# Patient Record
Sex: Female | Born: 1995 | Race: White | Hispanic: No | Marital: Single | Smoking: Former smoker
Health system: Southern US, Community
[De-identification: ages and names within clinical notes are randomized; demographics above are authoritative.]

## PROBLEM LIST (undated history)

## (undated) DIAGNOSIS — R519 Headache, unspecified: Secondary | ICD-10-CM

## (undated) DIAGNOSIS — F419 Anxiety disorder, unspecified: Secondary | ICD-10-CM

## (undated) DIAGNOSIS — F32A Depression, unspecified: Secondary | ICD-10-CM

## (undated) DIAGNOSIS — I471 Supraventricular tachycardia, unspecified: Secondary | ICD-10-CM

## (undated) DIAGNOSIS — F320 Major depressive disorder, single episode, mild: Secondary | ICD-10-CM

## (undated) DIAGNOSIS — R51 Headache: Secondary | ICD-10-CM

## (undated) DIAGNOSIS — R87612 Low grade squamous intraepithelial lesion on cytologic smear of cervix (LGSIL): Secondary | ICD-10-CM

## (undated) DIAGNOSIS — F319 Bipolar disorder, unspecified: Secondary | ICD-10-CM

## (undated) DIAGNOSIS — Z23 Encounter for immunization: Secondary | ICD-10-CM

## (undated) HISTORY — DX: Headache: R51

## (undated) HISTORY — DX: Low grade squamous intraepithelial lesion on cytologic smear of cervix (LGSIL): R87.612

## (undated) HISTORY — DX: Headache, unspecified: R51.9

## (undated) HISTORY — DX: Anxiety disorder, unspecified: F41.9

## (undated) HISTORY — DX: Depression, unspecified: F32.A

## (undated) HISTORY — DX: Encounter for immunization: Z23

## (undated) HISTORY — DX: Major depressive disorder, single episode, mild: F32.0

---
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
## 2014-09-05 ENCOUNTER — Emergency Department (HOSPITAL_COMMUNITY)
Admission: EM | Admit: 2014-09-05 | Discharge: 2014-09-05 | Disposition: A | Discharge status: Other Acute Inpatient Hospital | Payer: BLUE CROSS/BLUE SHIELD | Attending: Emergency Medicine | Admitting: Emergency Medicine

## 2014-09-05 ENCOUNTER — Inpatient Hospital Stay (HOSPITAL_COMMUNITY)
Admission: AD | Admit: 2014-09-05 | Discharge: 2014-09-12 | DRG: 885 | Disposition: A | Discharge status: Home / Self Care | Payer: BLUE CROSS/BLUE SHIELD | Attending: Psychiatry | Admitting: Psychiatry

## 2014-09-05 ENCOUNTER — Encounter (HOSPITAL_COMMUNITY): Payer: Self-pay

## 2014-09-05 ENCOUNTER — Encounter (HOSPITAL_COMMUNITY): Payer: Self-pay | Admitting: *Deleted

## 2014-09-05 ENCOUNTER — Encounter (HOSPITAL_COMMUNITY): Payer: Self-pay | Admitting: Emergency Medicine

## 2014-09-05 DIAGNOSIS — F410 Panic disorder [episodic paroxysmal anxiety] without agoraphobia: Secondary | ICD-10-CM | POA: Diagnosis present

## 2014-09-05 DIAGNOSIS — G4700 Insomnia, unspecified: Secondary | ICD-10-CM | POA: Diagnosis present

## 2014-09-05 DIAGNOSIS — F332 Major depressive disorder, recurrent severe without psychotic features: Principal | ICD-10-CM | POA: Diagnosis present

## 2014-09-05 DIAGNOSIS — Z3202 Encounter for pregnancy test, result negative: Secondary | ICD-10-CM | POA: Insufficient documentation

## 2014-09-05 DIAGNOSIS — R45851 Suicidal ideations: Secondary | ICD-10-CM | POA: Diagnosis present

## 2014-09-05 DIAGNOSIS — F1220 Cannabis dependence, uncomplicated: Secondary | ICD-10-CM | POA: Diagnosis present

## 2014-09-05 DIAGNOSIS — F419 Anxiety disorder, unspecified: Secondary | ICD-10-CM | POA: Insufficient documentation

## 2014-09-05 DIAGNOSIS — F32A Depression, unspecified: Secondary | ICD-10-CM

## 2014-09-05 DIAGNOSIS — F12280 Cannabis dependence with cannabis-induced anxiety disorder: Secondary | ICD-10-CM | POA: Insufficient documentation

## 2014-09-05 DIAGNOSIS — F329 Major depressive disorder, single episode, unspecified: Secondary | ICD-10-CM | POA: Insufficient documentation

## 2014-09-05 DIAGNOSIS — Z793 Long term (current) use of hormonal contraceptives: Secondary | ICD-10-CM | POA: Diagnosis not present

## 2014-09-05 DIAGNOSIS — R110 Nausea: Secondary | ICD-10-CM | POA: Diagnosis not present

## 2014-09-05 DIAGNOSIS — F3112 Bipolar disorder, current episode manic without psychotic features, moderate: Secondary | ICD-10-CM | POA: Diagnosis present

## 2014-09-05 HISTORY — DX: Bipolar disorder, unspecified: F31.9

## 2014-09-05 LAB — COMPREHENSIVE METABOLIC PANEL
ALK PHOS: 40 U/L (ref 39–117)
ALT: 28 U/L (ref 0–35)
ANION GAP: 16 — AB (ref 5–15)
AST: 32 U/L (ref 0–37)
Albumin: 5 g/dL (ref 3.5–5.2)
BUN: 8 mg/dL (ref 6–23)
CALCIUM: 9.4 mg/dL (ref 8.4–10.5)
CO2: 21 mmol/L (ref 19–32)
Chloride: 103 mmol/L (ref 96–112)
Creatinine, Ser: 0.78 mg/dL (ref 0.50–1.10)
GFR calc non Af Amer: >90 mL/min (ref >90)
GLUCOSE: 73 mg/dL (ref 70–99)
Potassium: 3.8 mmol/L (ref 3.5–5.1)
Sodium: 140 mmol/L (ref 135–145)
Total Bilirubin: 0.7 mg/dL (ref 0.3–1.2)
Total Protein: 8 g/dL (ref 6.0–8.3)

## 2014-09-05 LAB — POC URINE PREG, ED: PREG TEST UR: NEGATIVE

## 2014-09-05 LAB — CBC
HCT: 40.8 % (ref 36.0–46.0)
Hemoglobin: 14 g/dL (ref 12.0–15.0)
MCH: 30.8 pg (ref 26.0–34.0)
MCHC: 34.3 g/dL (ref 30.0–36.0)
MCV: 89.9 fL (ref 78.0–100.0)
Platelets: 204 10*3/uL (ref 150–400)
RBC: 4.54 MIL/uL (ref 3.87–5.11)
RDW: 12.4 % (ref 11.5–15.5)
WBC: 9 10*3/uL (ref 4.0–10.5)

## 2014-09-05 LAB — RAPID URINE DRUG SCREEN, HOSP PERFORMED
AMPHETAMINES: NOT DETECTED
BENZODIAZEPINES: NOT DETECTED
Barbiturates: NOT DETECTED
COCAINE: NOT DETECTED
OPIATES: NOT DETECTED
TETRAHYDROCANNABINOL: POSITIVE — AB

## 2014-09-05 LAB — ETHANOL: Alcohol, Ethyl (B): <5 mg/dL (ref 0–9)

## 2014-09-05 LAB — CBG MONITORING, ED
GLUCOSE-CAPILLARY: 71 mg/dL (ref 70–99)
Glucose-Capillary: 67 mg/dL — ABNORMAL LOW (ref 70–99)

## 2014-09-05 MED ORDER — HYDROXYZINE HCL 25 MG PO TABS
25.0000 mg | ORAL_TABLET | Freq: Four times a day (QID) | ORAL | Status: DC | PRN
  Administered 2014-09-05 – 2014-09-10 (×10): 25 mg via ORAL
  Filled 2014-09-05 (×12): qty 1

## 2014-09-05 MED ORDER — MAGNESIUM HYDROXIDE 400 MG/5ML PO SUSP: 30.0000 mL | Freq: Every day | ORAL | Status: DC | PRN

## 2014-09-05 MED ORDER — DROSPIRENONE-ETHINYL ESTRADIOL 3-0.02 MG PO TABS
1.0000 | ORAL_TABLET | Freq: Every day | ORAL | Status: DC
  Administered 2014-09-06 – 2014-09-12 (×7): 1 via ORAL

## 2014-09-05 MED ORDER — ACETAMINOPHEN 325 MG PO TABS
650.0000 mg | ORAL_TABLET | Freq: Four times a day (QID) | ORAL | Status: DC | PRN
Start: 2014-09-05 — End: 2014-09-12
  Administered 2014-09-07 – 2014-09-10 (×6): 650 mg via ORAL
  Filled 2014-09-05 (×6): qty 2

## 2014-09-05 MED ORDER — ALUM & MAG HYDROXIDE-SIMETH 200-200-20 MG/5ML PO SUSP
30.0000 mL | ORAL | Status: DC | PRN
  Administered 2014-09-07: 30 mL via ORAL
  Filled 2014-09-05: qty 30

## 2014-09-05 MED ORDER — ONDANSETRON HCL 4 MG PO TABS
4.0000 mg | ORAL_TABLET | Freq: Three times a day (TID) | ORAL | Status: DC | PRN
  Administered 2014-09-05: 4 mg via ORAL
  Filled 2014-09-05: qty 1

## 2014-09-05 MED ORDER — IBUPROFEN 200 MG PO TABS: 600.0000 mg | ORAL_TABLET | Freq: Three times a day (TID) | ORAL | Status: DC | PRN

## 2014-09-05 MED ORDER — ZOLPIDEM TARTRATE 5 MG PO TABS
5.0000 mg | ORAL_TABLET | Freq: Every day | ORAL | Status: DC
  Administered 2014-09-05 – 2014-09-06 (×2): 5 mg via ORAL
  Filled 2014-09-05 (×2): qty 1

## 2014-09-05 MED ORDER — ENSURE ENLIVE PO LIQD: 237.0000 mL | Freq: Two times a day (BID) | ORAL | Status: DC

## 2014-09-05 MED ORDER — LORAZEPAM 1 MG PO TABS
1.0000 mg | ORAL_TABLET | Freq: Three times a day (TID) | ORAL | Status: DC | PRN
  Administered 2014-09-05: 1 mg via ORAL
  Filled 2014-09-05: qty 1

## 2014-09-05 MED ORDER — ACETAMINOPHEN 325 MG PO TABS: 650.0000 mg | ORAL_TABLET | ORAL | Status: DC | PRN

## 2014-09-05 NOTE — ED Notes (Signed)
[facility] bed [facility]. I am about to call report at [phone] as advised by the [facility] represantive caller.

## 2014-09-05 NOTE — Tx Team (Signed)
Initial Interdisciplinary Treatment Plan   PATIENT STRESSORS: Financial difficulties Medication change or noncompliance   PATIENT STRENGTHS: Average or above average intelligence Capable of independent living General fund of knowledge   PROBLEM LIST: Problem List/Patient Goals Date to be addressed Date deferred Reason deferred Estimated date of resolution  I want ot work on my depression and anxiety                                                       DISCHARGE CRITERIA:  Ability to meet basic life and health needs Improved stabilization in mood, thinking, and/or behavior Verbal commitment to aftercare and medication compliance  PRELIMINARY DISCHARGE PLAN: Attend aftercare/continuing care group Return to previous living arrangement Return to previous work or school arrangements  PATIENT/FAMIILY INVOLVEMENT: This treatment plan has been presented to and reviewed with the patient, Emily Hansen, and/or family member, .  The patient and family have been given the opportunity to ask questions and make suggestions.  [name], [name] J 09/05/2014, 9:31 PM

## 2014-09-05 NOTE — ED Notes (Signed)
Pt requesting to eat prior to admit/transfer, we have trays being delivered. Call[facility] then [place] to complete the transfer while pt eats dinner.

## 2014-09-05 NOTE — ED Notes (Signed)
Per pt, states she has been dealing with sadness since the 9 th grade-[facility]
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
## 2014-09-05 NOTE — ED Provider Notes (Signed)
CSN: [id number]     Arrival date & time 09/05/14  1437 History   First MD Initiated Contact with Patient 09/05/14 1549     Chief Complaint  Patient presents with  . Depression     (Consider location/radiation/quality/duration/timing/severity/associated sxs/prior Treatment) HPI Comments: Patient with PMH of bipolar presents to the ED with anxiety, anorexia, and depression.  Patient states that she has not eaten for the past 2 weeks because she feels anxious and nauseated.  Patient states that she has been dealing with depression since she was in the 9th grade.  She states that her symptoms are worsening, and she would like professional help.  She denies any SI or HI, but states that if she were to get "hit by a car," she wouldn't care.  She reports drinking Etoh occasionally, and using pot regularly.  There are no aggravating or alleviating factors.  The history is provided by the patient. No language interpreter was used.    Past Medical History  Diagnosis Date  . Bipolar disorder    History reviewed. No pertinent past surgical history. No family history on file. History  Substance Use Topics  . Smoking status: Never Smoker   . Smokeless tobacco: Not on file  . Alcohol Use: Yes     Comment: has not drank in 2 weeks, social drinker   OB History    No data available     Review of Systems  Constitutional: Negative for fever and chills.  Respiratory: Negative for shortness of breath.   Cardiovascular: Negative for chest pain.  Gastrointestinal: Positive for nausea. Negative for vomiting, diarrhea and constipation.  Genitourinary: Negative for dysuria.  Psychiatric/Behavioral: Positive for behavioral problems. The patient is nervous/anxious.   All other systems reviewed and are negative.     Allergies  Prozac  Home Medications   Prior to Admission medications   Medication Sig Start Date End Date Taking? Authorizing Provider  drospirenone-ethinyl estradiol  (YAZ,GIANVI,LORYNA) 3-0.02 MG tablet Take 1 tablet by mouth daily.    Historical Provider, MD   BP 133/88 mmHg  Pulse 84  Temp(Src) 98.2 F (36.8 C) (Oral)  Resp 18  SpO2 100%  LMP 09/05/2014 Physical Exam  Constitutional: She is oriented to person, place, and time. She appears well-developed and well-nourished.  HENT:  Head: Normocephalic and atraumatic.  Eyes: Conjunctivae and EOM are normal. Pupils are equal, round, and reactive to light.  Neck: Normal range of motion. Neck supple.  Cardiovascular: Normal rate and regular rhythm.  Exam reveals no gallop and no friction rub.   No murmur heard. Pulmonary/Chest: Effort normal and breath sounds normal. No respiratory distress. She has no wheezes. She has no rales. She exhibits no tenderness.  Abdominal: Soft. Bowel sounds are normal. She exhibits no distension and no mass. There is no tenderness. There is no rebound and no guarding.  No focal abdominal tenderness, no RLQ tenderness or pain at McBurney's point, no RUQ tenderness or Murphy's sign, no left-sided abdominal tenderness, no fluid wave, or signs of peritonitis   Musculoskeletal: Normal range of motion. She exhibits no edema or tenderness.  Moves all extremities, right hand ROM and strength 5/5, no bony abnormality or deformity, mild contusion to right 5th MCP  Neurological: She is alert and oriented to person, place, and time.  Skin: Skin is warm and dry.  Psychiatric: She has a normal mood and affect. Her behavior is normal. Judgment and thought content normal.  Nursing note and vitals reviewed.   ED Course  Procedures (including critical care time) Results for orders placed or performed during the hospital encounter of 09/05/14  CBC  Result Value Ref Range   WBC 9.0 4.0 - 10.5 K/uL   RBC 4.54 3.87 - 5.11 MIL/uL   Hemoglobin 14.0 12.0 - 15.0 g/dL   HCT 28.4 13.2 - 44.0 %   MCV 89.9 78.0 - 100.0 fL   MCH 30.8 26.0 - 34.0 pg   MCHC 34.3 30.0 - 36.0 g/dL   RDW 10.2 72.5  - 36.6 %   Platelets 204 150 - 400 K/uL  Comprehensive metabolic panel  Result Value Ref Range   Sodium 140 135 - 145 mmol/L   Potassium 3.8 3.5 - 5.1 mmol/L   Chloride 103 96 - 112 mmol/L   CO2 21 19 - 32 mmol/L   Glucose, Bld 73 70 - 99 mg/dL   BUN 8 6 - 23 mg/dL   Creatinine, Ser 4.40 0.50 - 1.10 mg/dL   Calcium 9.4 8.4 - 34.7 mg/dL   Total Protein 8.0 6.0 - 8.3 g/dL   Albumin 5.0 3.5 - 5.2 g/dL   AST 32 0 - 37 U/L   ALT 28 0 - 35 U/L   Alkaline Phosphatase 40 39 - 117 U/L   Total Bilirubin 0.7 0.3 - 1.2 mg/dL   GFR calc non Af Amer >90 >90 mL/min   GFR calc Af Amer >90 >90 mL/min   Anion gap 16 (H) 5 - 15  Ethanol (ETOH)  Result Value Ref Range   Alcohol, Ethyl (B) <5 0 - 9 mg/dL  Urine rapid drug screen (hosp performed)  Result Value Ref Range   Opiates NONE DETECTED NONE DETECTED   Cocaine NONE DETECTED NONE DETECTED   Benzodiazepines NONE DETECTED NONE DETECTED   Amphetamines NONE DETECTED NONE DETECTED   Tetrahydrocannabinol POSITIVE (A) NONE DETECTED   Barbiturates NONE DETECTED NONE DETECTED  CBG monitoring, ED  Result Value Ref Range   Glucose-Capillary 67 (L) 70 - 99 mg/dL  POC Urine Pregnancy, ED (if pre-menopausal female) - NOT at [facility]  Result Value Ref Range   Preg Test, Ur NEGATIVE NEGATIVE  CBG monitoring, ED  Result Value Ref Range   Glucose-Capillary 71 70 - 99 mg/dL   No results found.    EKG Interpretation None      MDM   Final diagnoses:  Depression   Patient here for depression and anxiety.  Reports not eating in 2 weeks 2/2 anxiety and nausea.  Will check labs and consult TTS.  Patient punched wall 2 days ago with right hand and has mild bruise, but no bony abnormality or ROM/strength deficits.  Declines imaging.  I doubt fracture.  Accepted to [facility]   Roxy Horseman, PA-C 09/05/14 1914  Pricilla Loveless, MD 09/08/14 [phone]

## 2014-09-05 NOTE — Progress Notes (Signed)
D   Pt is a 19 year old female admitted with depression and anxiety    Pt reports mood swings and impulsive decisions   She also said she has lost weight due to lack of appetite  She also reports poor sleep   Pt is cooperative and pleasant   She is fidgety and anxious    Pt drinks occasionally but smokes pot regularly   Pt said stressors include break up of relationship with her friend     Pt was oriented to the unit and offered nourishment   She said she thinks she would be better off dead but would not act on her thoughts    orders received   Medications administered and effectiveness monitored   Q 15 min checks implemented and explained    Pt is adjusting well

## 2014-09-06 ENCOUNTER — Encounter (HOSPITAL_COMMUNITY): Payer: Self-pay | Admitting: Psychiatry

## 2014-09-06 DIAGNOSIS — F12280 Cannabis dependence with cannabis-induced anxiety disorder: Secondary | ICD-10-CM | POA: Insufficient documentation

## 2014-09-06 DIAGNOSIS — F332 Major depressive disorder, recurrent severe without psychotic features: Secondary | ICD-10-CM

## 2014-09-06 MED ORDER — GABAPENTIN 100 MG PO CAPS
100.0000 mg | ORAL_CAPSULE | Freq: Three times a day (TID) | ORAL | Status: DC
  Administered 2014-09-06 – 2014-09-10 (×13): 100 mg via ORAL
  Filled 2014-09-06 (×19): qty 1

## 2014-09-06 MED ORDER — BOOST / RESOURCE BREEZE PO LIQD
1.0000 | Freq: Two times a day (BID) | ORAL | Status: DC
  Administered 2014-09-06 – 2014-09-07 (×2): 1 via ORAL
  Filled 2014-09-06 (×10): qty 1

## 2014-09-06 MED ORDER — BUPROPION HCL ER (XL) 150 MG PO TB24
150.0000 mg | ORAL_TABLET | Freq: Every day | ORAL | Status: DC
  Administered 2014-09-07 – 2014-09-11 (×5): 150 mg via ORAL
  Filled 2014-09-06 (×8): qty 1
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
## 2014-09-06 NOTE — Progress Notes (Addendum)
Patient observed sitting in dayroom with peers. Spoke with her 1:1. She is anxious in affect as well as mood. Rates her depression and hopelessness at an 8/10 and her anxiety at a 9/10.  She reports this anxiety has manifested itself in the form of nausea and tightness in the throat. Reports vomiting x 2. States she was given vistaril last night but it only had minimal effect. She did elect to try another dose. No other AM meds ordered at this time. Patient given support and reassurance. Will reassess effectiveness in 1 hour. Denies pain. Patient was able to eat a small amount of breakfast. Denies SI/HI and remains safe. Lawrence MarseillesFriedman, Gabe Glace Eakes

## 2014-09-06 NOTE — Progress Notes (Signed)
Pt attended evening Karaoke group and was engaged.

## 2014-09-06 NOTE — Clinical Social Work Note (Signed)
[facility].  Samuella BruinKristin Cassey Bacigalupo, MSW, [facility]WA Clinical Social Worker [facility] [phone]

## 2014-09-06 NOTE — [facility] Suicide Risk Assessment (Signed)
[facility] INPATIENT:  Family/Significant Other Suicide Prevention Education  Suicide Prevention Education:  Patient Refusal for Family/Significant Other Suicide Prevention Education: The patient Emily Hansen has refused to provide written consent for family/significant other to be provided Family/Significant Other Suicide Prevention Education during admission and/or prior to discharge.  Physician notified. SPE reviewed with patient and brochure provided. Patient encouraged to return to hospital if having suicidal thoughts, patient verbalized his/her understanding and has no further questions at this time.   Nzinga Ferran, West CarboKristin L 09/06/2014, 12:44 PM
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
## 2014-09-06 NOTE — Progress Notes (Signed)
NUTRITION ASSESSMENT  Pt identified as at risk on the Malnutrition Screen Tool  INTERVENTION: 1. Educated patient on the importance of nutrition and encouraged intake of food and beverages. 2. Discussed weight goals. 3. Supplements: Resource Breeze po BID, each supplement provides 250 kcal and 9 grams of protein  NUTRITION DIAGNOSIS: Inadequate oral intake related to nausea from anxiety as evidenced by weight loss and poor po  Goal: Pt to meet >/= 90% of their estimated nutrition needs.  Monitor:  PO intake  Assessment:  Pt reports being unable to tolerate po due to anxiety and nausea. She said that she either vomits or feels sick whenever she tries to eat or drink, even water. Attributes this to anxiety. She reports weight gain several months ago due to new birth control, but says that her weight has dropped 12-14 lbs recently. Agreed to try nutritional supplements to improve po intake.  Labs and medications reviewed    19 y.o. female  Height: Ht Readings from Last 1 Encounters:  09/05/14 5\' 8"  (1.727 m) (93 %*, Z = 1.45)   * Growth percentiles are based on CDC 2-20 Years data.    Weight: Wt Readings from Last 1 Encounters:  09/05/14 164 lb (74.39 kg) (89 %*, Z = 1.23)   * Growth percentiles are based on CDC 2-20 Years data.    Weight Hx: Wt Readings from Last 10 Encounters:  09/05/14 164 lb (74.39 kg) (89 %*, Z = 1.23)   * Growth percentiles are based on CDC 2-20 Years data.    BMI:  Body mass index is 24.94 kg/(m^2). Pt meets criteria for normal body weight based on current BMI.  Estimated Nutritional Needs: Kcal: 25-30 kcal/kg Protein: > 1 gram protein/kg Fluid: 1 ml/kcal  Diet Order: Diet regular Room service appropriate?: Yes; Fluid consistency:: Thin Pt is also offered choice of unit snacks mid-morning and mid-afternoon.  Pt is eating as desired.   Lab results and medications reviewed.   Emmaline KluverHaley Truman Aceituno MS, RD, LDN [phone]
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
## 2014-09-06 NOTE — BHH Group Notes (Signed)
Patient attended morning nursing education group and participated appropriately. Emily Hansen, Emily Hansen

## 2014-09-06 NOTE — B[facility] Group Notes (Signed)
B[facility] LCSW Group Therapy 09/06/2014 1:15 PM Type of Therapy: Group Therapy Participation Level: Active  Participation Quality: Attentive, Sharing and Supportive  Affect: Depressed and Flat  Cognitive: Alert and Oriented  Insight: Developing/Improving and Engaged  Engagement in Therapy: Developing/Improving and Engaged  Modes of Intervention: Activity, Clarification, Confrontation, Discussion, Education, Exploration, Limit-setting, Orientation, Problem-solving, Rapport Building, Reality Testing, Socialization and Support  Summary of Progress/Problems: Patient was attentive and engaged with speaker from [employer]. Patient was attentive to speaker while they shared their story of dealing with mental health and overcoming it. Patient expressed interest in their programs and services and received information on their agency. Patient processed ways they can relate to the speaker.   Emily Hansen, MSW, LCSWA Clinical Social Worker [facility] [phone]   

## 2014-09-07 LAB — TSH: TSH: 4.037 u[IU]/mL (ref 0.350–4.500)

## 2014-09-07 MED ORDER — SUCRALFATE 1 G PO TABS
1.0000 g | ORAL_TABLET | Freq: Three times a day (TID) | ORAL | Status: DC
  Administered 2014-09-07 – 2014-09-12 (×21): 1 g via ORAL
  Filled 2014-09-07 (×25): qty 1

## 2014-09-07 MED ORDER — TRAZODONE HCL 50 MG PO TABS
50.0000 mg | ORAL_TABLET | Freq: Every evening | ORAL | Status: DC | PRN
  Administered 2014-09-07: 50 mg via ORAL
  Filled 2014-09-07: qty 1

## 2014-09-07 MED ORDER — PANTOPRAZOLE SODIUM 20 MG PO TBEC
20.0000 mg | DELAYED_RELEASE_TABLET | Freq: Every day | ORAL | Status: DC
  Administered 2014-09-07 – 2014-09-08 (×2): 20 mg via ORAL
  Filled 2014-09-07 (×5): qty 1

## 2014-09-07 MED ORDER — HYDROXYZINE HCL 25 MG PO TABS
25.0000 mg | ORAL_TABLET | Freq: Once | ORAL | Status: AC
  Administered 2014-09-07: 25 mg via ORAL
  Filled 2014-09-07: qty 1

## 2014-09-07 NOTE — Clinical Social Work Note (Signed)
Referral made to [facility] for medication management.  Emily BruinKristin Raaga Hansen, MSW, [employer]CSWA Clinical Social Worker [facility] [phone]

## 2014-09-07 NOTE — Plan of Care (Signed)
Problem: Diagnosis: Increased Risk For Suicide Attempt Goal: STG-Patient Will Attend All Groups On The Unit Outcome: Progressing Pt attended evening group on 09/06/14. Goal: STG-Patient Will Comply With Medication Regime Outcome: Progressing Pt has been compliant with medications this shift.

## 2014-09-07 NOTE — Progress Notes (Signed)
Pt stated that today has been a "crappy day". It was very helpful for her to talk about  Relapsing.

## 2014-09-07 NOTE — Clinical Social Work Note (Signed)
CSW spoke with English as a second language teachermanager Crystal at [employer] regarding patient's absence from work as patient had request.  Samuella BruinKristin Kawehi Hostetter, MSW, [employer]CSWA Clinical Social Worker [facility] [phone]

## 2014-09-07 NOTE — Progress Notes (Signed)
D: Pt has anxious affect and mood.  When describing her day, pt states "it was bad to begin with."  Pt reports her mood has fluctuated throughout the day, stating "earlier I was smiling so much tears came to my eyes and then I was feeling down at other times during the day."  She reports her goal today was "to talk to the doctor and see what he thought I needed to be on."  Pt denies HI, denies hallucinations, denies pain.  Pt reports passive SI without a plan.  She verbally contracts for safety.  Pt has been visible in milieu interacting with peers and staff appropriately.  Pt attended evening group.   A: Introduced self to pt.  Met with pt 1:1 and provided support and encouragement.  Actively listened to pt.  Medications administered per order.  PRN medication administered for anxiety.  Pt reported she was unable to sleep and felt anxious.  On-call provider notified.  Vistaril 25 mg POX1 ordered and administered.   R: Pt is compliant with medications.  Pt verbally contracts for safety.  Will continue to monitor and assess.

## 2014-09-07 NOTE — Clinical Social Work Note (Signed)
Patient expressed interest in DBT therapy. CSW left message for [facility] to initiate referral.  Samuella BruinKristin Eleisha Branscomb, MSW, [facility] Center[facility]WA Clinical Social Worker [facility] [phone]

## 2014-09-07 NOTE — [facility] Group Notes (Signed)
   [facility]  09/07/2014  8:45 AM   Participation Quality: Alert, Appropriate and Oriented  Mood/Affect: Depressed and Flat  Depression Rating: 6  Anxiety Rating: 8  Thoughts of Suicide: Pt denies SI/HI  Will you contract for safety? Yes  Current AVH: Pt denies  Plan for Discharge/Comments: Pt attended discharge planning group and actively participated in group. CSW provided pt with today's workbook. Patient reports feeling "tired" today. Patient reports that she did not sleep well last night. She plans on returning home to follow up with outpatient services. She declined PHP due to conflict with her work schedule. She is requesting DBT therapist.  Transportation Means: Pt reports access to transportation  Supports: No supports mentioned at this time  Samuella BruinKristin Cullen Lahaie, MSW, [employer]CSWA Clinical Social Worker [facility] [phone]

## 2014-09-07 NOTE — Progress Notes (Addendum)
[facility] MD Progress Note  09/07/2014 3:22 PM Emily Hansen  MRN:  [id number] Subjective:  Today reports somewhat improved mood, but states she is having some physical aches, and pains, primarily back pain, which she feels may be related to the hospital bed . She also complains of GERD type symptoms, such as heartburn, nausea, occasional vomiting , particularly after heavy meals. Denies medication side effects. Objective : I have discussed case with treatment team and have met with patient. As discussed with staff patient continues to present depressed and to rate her depression and anxiety symptoms as high. Patient presents partially improved compared to admission. Still depressed, and anxious, but to a lesser degree. She reports  No medication side effects. She has been going to some groups and has been visible in day room. We discussed her GI symptoms, she denies any intentional vomiting or purging and denies history of eating disorder- rather, states she vomits due to " feeling like the vomit is going up and down my esophagus".  No disruptive behaviors on unit. She is going to some groups. TSH WNL. Principal Problem: Major depressive disorder, recurrent, severe without psychotic features Diagnosis:   Patient Active Problem List   Diagnosis Date Noted  . Major depressive disorder, recurrent, severe without psychotic features [F33.2]   . Cannabis dependence with cannabis-induced anxiety disorder [F12.280]   . Bipolar 1 disorder, manic, moderate [F31.12] 09/05/2014   Total Time spent with patient: 25 minutes    Past Medical History:  Past Medical History  Diagnosis Date  . Bipolar disorder    History reviewed. No pertinent past surgical history. Family History: History reviewed. No pertinent family history. Social History:  History  Alcohol Use  . Yes    Comment: has not drank in 2 weeks, social drinker     History  Drug Use  . Yes  . Special: Marijuana    Comment: daily use     History   Social History  . Marital Status: Single    Spouse Name: N/A  . Number of Children: N/A  . Years of Education: N/A   Social History Main Topics  . Smoking status: Never Smoker   . Smokeless tobacco: Not on file  . Alcohol Use: Yes     Comment: has not drank in 2 weeks, social drinker  . Drug Use: Yes    Special: Marijuana     Comment: daily use  . Sexual Activity: Not on file   Other Topics Concern  . None   Social History Narrative   Additional History:    Sleep: Fair  Appetite:  Fair   Assessment:   Musculoskeletal: Strength & Muscle Tone: within normal limits Gait & Station: normal Patient leans: N/A   Psychiatric Specialty Exam: Physical Exam  Review of Systems  Constitutional: Negative for fever and chills.  Eyes: Negative.   Respiratory: Negative for cough and shortness of breath.   Cardiovascular: Negative for chest pain.  Gastrointestinal: Positive for heartburn, nausea and vomiting. Negative for blood in stool.  Genitourinary: Negative for dysuria, urgency and frequency.  Musculoskeletal: Positive for back pain.  Skin: Negative for rash.  Neurological: Negative for seizures and headaches.  Psychiatric/Behavioral: Positive for depression.    Blood pressure 118/94, pulse 104, temperature 98.2 F (36.8 C), temperature source Oral, resp. rate 16, height 5' 8" (1.727 m), weight 164 lb (74.39 kg), last menstrual period 09/05/2014.Body mass index is 24.94 kg/(m^2).  General Appearance: improved grooming   Eye Contact::  Good  Speech:  Normal Rate  Volume:  Normal  Mood:  depressed, but seems somewhat improved compared to admission  Affect:  Constricted  Thought Process:  Goal Directed and Linear  Orientation:  Full (Time, Place, and Person)  Thought Content:  denies hallucinations, no delusions, not internally preoccupied   Suicidal Thoughts:  No at this time denies any thoughts of hurting self and  contracts for safety on unit   Homicidal  Thoughts:  No  Memory:   Recent and remote grossly intact   Judgement:  Fair  Insight:  Fair  Psychomotor Activity:  Normal  Concentration:  Good  Recall:  Good  Fund of Knowledge:Good  Language: Good  Akathisia:  Negative  Handed:  Right  AIMS (if indicated):     Assets:  Communication Skills Desire for Improvement Physical Health Social Support Vocational/Educational  ADL's:  improving  Cognition: WNL  Sleep:  Number of Hours: 3.75     Current Medications: Current Facility-Administered Medications  Medication Dose Route Frequency Provider Last Rate Last Dose  . acetaminophen (TYLENOL) tablet 650 mg  650 mg Oral Q6H PRN Laverle Hobby, PA-C   650 mg at 09/07/14 1430  . alum & mag hydroxide-simeth (MAALOX/MYLANTA) 200-200-20 MG/5ML suspension 30 mL  30 mL Oral Q4H PRN Laverle Hobby, PA-C   30 mL at 09/07/14 1429  . buPROPion (WELLBUTRIN XL) 24 hr tablet 150 mg  150 mg Oral Daily Jenne Campus, MD   150 mg at 09/07/14 0752  . drospirenone-ethinyl estradiol (YAZ,GIANVI,LORYNA) 3-0.02 MG per tablet 1 tablet  1 tablet Oral Daily Laverle Hobby, PA-C   1 tablet at 09/07/14 0539  . feeding supplement (RESOURCE BREEZE) (RESOURCE BREEZE) liquid 1 Container  1 Container Oral BID BM Dorann Ou, RD   1 Container at 09/07/14 1429  . gabapentin (NEURONTIN) capsule 100 mg  100 mg Oral TID Jenne Campus, MD   100 mg at 09/07/14 1148  . hydrOXYzine (ATARAX/VISTARIL) tablet 25 mg  25 mg Oral Q6H PRN Laverle Hobby, PA-C   25 mg at 09/07/14 0754  . magnesium hydroxide (MILK OF MAGNESIA) suspension 30 mL  30 mL Oral Daily PRN Laverle Hobby, PA-C      . pantoprazole (PROTONIX) EC tablet 20 mg  20 mg Oral Daily Myer Peer , MD   20 mg at 09/07/14 1051  . sucralfate (CARAFATE) tablet 1 g  1 g Oral TID WC & HS Jenne Campus, MD   1 g at 09/07/14 1148  . traZODone (DESYREL) tablet 50 mg  50 mg Oral QHS PRN Jenne Campus, MD        Lab Results:  Results for orders placed  or performed during the hospital encounter of 09/05/14 (from the past 48 hour(s))  TSH     Status: None   Collection Time: 09/07/14  6:23 AM  Result Value Ref Range   TSH 4.037 0.350 - 4.500 uIU/mL    Comment: Performed at [facility]    Physical Findings: AIMS: Facial and Oral Movements Muscles of Facial Expression: None, normal Lips and Perioral Area: None, normal Jaw: None, normal Tongue: None, normal,Extremity Movements Upper (arms, wrists, hands, fingers): None, normal Lower (legs, knees, ankles, toes): None, normal, Trunk Movements Neck, shoulders, hips: None, normal, Overall Severity Severity of abnormal movements (highest score from questions above): None, normal Incapacitation due to abnormal movements: None, normal Patient's awareness of abnormal movements (rate only patient's report): No Awareness, Dental Status  Current problems with teeth and/or dentures?: No Does patient usually wear dentures?: No  CIWA:    COWS:      Assessment- patient remains depressed, but presents with some improvement and is not suicidal or psychotic. Some somatic focus/preoccupations, and reports GERD type symptoms and chronic back pain, worsened by current hospital bed. Tolerating medications well thus far.  Treatment Plan Summary: Daily contact with patient to assess and evaluate symptoms and progress in treatment, Medication management, Plan continue inpatient treatment and continue medication management as below  Neurontin  100 mgrs TID for pain and anxiety Wellbutrin XL 150 mgrs QDAY for depression D/C Ambien as patient does not feel it is helping her sleep much. Start Trazodone 50 mgrs QHS PRN Insomnia Started on Protonix and Carafate for GI symptoms.  Medical Decision Making:  Established Problem, Stable/Improving (1), Review of Psycho-Social Stressors (1), Review or order clinical lab tests (1), Review of Medication Regimen & Side Effects (2) and Review of New  Medication or Change in Dosage (2)     Henning Ehle 09/07/2014, 3:22 PM

## 2014-09-07 NOTE — Progress Notes (Signed)
D:  Per pt self inventory pt reports sleeping poorly and that the sleep medication did not help, appetite poor, energy level low, ability to pay attention poor, rates depression at a 7 out of 10, hopelessness at a 7 out of 10, anxiety at a 9 out of 10, denies SI/HI/AVH, pt was anxious during interaction, complaining that her whole body feels like it is covered in bruises--MD made aware--pt's goal for today "Working on my appetite", and her plan to achieve her goal today is "to try and eat at least a little and not throw up", pt requested to be weighed and stated that she does not want to lose weight due to her appetite and nausea so she wants to keep a check on her weight, pt refused ensure and resource this am and stated that she threw up after breakfast.     A:  Emotional support provided, Encouraged pt to continue with treatment plan and attend all group activities, q15 min checks maintained for safety.  R:  Pt is receptive, going to groups, interacts appropriately, cooperative and pleasant with staff and other patients on the unit.

## 2014-09-07 NOTE — [facility] Group Notes (Signed)
[facility] LCSW Group Therapy 09/07/2014 1:15 PM Type of Therapy: Group Therapy Participation Level: Active  Participation Quality: Attentive, Sharing and Supportive  Affect: Appropriate  Cognitive: Alert and Oriented  Insight: Developing/Improving and Engaged  Engagement in Therapy: Developing/Improving and Engaged  Modes of Intervention: Clarification, Confrontation, Discussion, Education, Exploration, Limit-setting, Orientation, Problem-solving, Rapport Building, Dance movement psychotherapisteality Testing, Socialization and Support  Summary of Progress/Problems: The topic for today was feelings about relapse. Pt discussed what relapse prevention is to them and identified triggers that they are on the path to relapse. Pt processed their feeling towards relapse and was able to relate to peers. Pt discussed coping skills that can be used for relapse prevention. [facility] LCSW Group Therapy 09/07/2014 1:15 PM Type of Therapy: Group Therapy Participation Level: Active  Participation Quality: Attentive, Sharing and Supportive  Affect: Appropriate  Cognitive: Alert and Oriented  Insight: Developing/Improving and Engaged  Engagement in Therapy: Developing/Improving and Engaged  Modes of Intervention: Clarification, Confrontation, Discussion, Education, Exploration, Limit-setting, Orientation, Problem-solving, Rapport Building, Dance movement psychotherapisteality Testing, Socialization and Support  Summary of Progress/Problems: The topic for today was feelings about relapse. Pt discussed what relapse prevention is to them and identified triggers that they are on the path to relapse. Pt processed their feeling towards relapse and was able to relate to peers. Pt discussed coping skills that can be used for relapse prevention. Patient identified isolation and impulsive behavior as relapse behaviors. Patient shared that she has difficulty trusting others due to her past experiences. Patient engaged in discussion regarding healthy coping skills. CSW and other  group members provided patient with emotional support and encouragement.    Emily BruinKristin Yorel Hansen, MSW, [employer] Clinical Social Worker [facility] [phone]    Emily BruinKristin Julis Haubner, MSW, [facility] Clinical Social Worker [facility] [phone]

## 2014-09-07 NOTE — Tx Team (Signed)
Interdisciplinary Treatment Plan Update (Adult) Date: 09/07/2014   Time Reviewed: 9:30 AM  Progress in Treatment: Attending groups: Yes Participating in groups: Yes Taking medication as prescribed: Yes Tolerating medication: Yes Family/Significant other contact made: No, patient has declined for collateral contact Patient understands diagnosis: Yes Discussing patient identified problems/goals with staff: Yes Medical problems stabilized or resolved: Yes Denies suicidal/homicidal ideation: Yes Issues/concerns per patient self-inventory: Yes Other:  New problem(s) identified: N/A  Discharge Plan or Barriers:   4/22: Patient plans to return home with outpatient services yet to be determined. Patient has declined PHP due to work schedule.  Reason for Continuation of Hospitalization:  Depression Anxiety Medication Stabilization   Comments: N/A  Estimated length of stay: 2-3 days  For review of initial/current patient goals, please see plan of care.  Patient is a 19 year old female admitted for passive SI and bipolar disorder. [place] Stressors include a lack of social supports, recent loss of a friendship and romantic relationship, and her current living environment. Patient plans to return home to follow up with outpatient services at discharge. Patient will benefit from crisis stabilization, medication evaluation, group therapy, and psycho education in addition to case management for discharge planning. Patient and CSW reviewed pt's identified goals and treatment plan. Pt verbalized understanding and agreed to treatment plan.    Attendees: Patient:    Family:    Physician: Dr. Jama Flavorsobos; Dr. Dub MikesLugo 09/07/2014 9:30 AM  Nursing: Dellia CloudAndrea Thorne, Lendell CapriceBrittany Guthrie, Seward CarolBrittany Twyman , RN 09/07/2014 9:30 AM  Clinical Social Worker: Samuella BruinKristin Shakaria Raphael,  LCSWA 09/07/2014 9:30 AM  Other: Trula SladeHeather Smart, LCSWA  09/07/2014 9:30 AM  Other:  09/07/2014 9:30 AM   Other: Onnie BoerJennifer Clark, Case Manager 09/07/2014 9:30 AM  Other: Mosetta AnisAggie Nwoko, Laura Davis NP 09/07/2014 9:30 AM  Other:    Other:    Other:    Other:    Other:      Scribe for Treatment Team:  Samuella BruinKristin Valincia Touch, MSW, [employer] [phone]

## 2014-09-08 MED ORDER — ONDANSETRON 4 MG PO TBDP
ORAL_TABLET | ORAL | Status: AC
  Filled 2014-09-08: qty 1

## 2014-09-08 MED ORDER — TRAZODONE HCL 100 MG PO TABS
100.0000 mg | ORAL_TABLET | Freq: Every evening | ORAL | Status: DC | PRN
  Administered 2014-09-08 – 2014-09-09 (×2): 100 mg via ORAL
  Filled 2014-09-08 (×2): qty 1

## 2014-09-08 MED ORDER — ONDANSETRON 4 MG PO TBDP
4.0000 mg | ORAL_TABLET | Freq: Three times a day (TID) | ORAL | Status: DC | PRN
  Administered 2014-09-08 – 2014-09-10 (×2): 4 mg via ORAL
  Filled 2014-09-08: qty 1

## 2014-09-08 MED ORDER — PANTOPRAZOLE SODIUM 40 MG PO TBEC
40.0000 mg | DELAYED_RELEASE_TABLET | Freq: Every day | ORAL | Status: DC
  Administered 2014-09-09 – 2014-09-12 (×4): 40 mg via ORAL
  Filled 2014-09-08 (×6): qty 1

## 2014-09-08 NOTE — Progress Notes (Addendum)
Went to patient's room.  She was in the bathroom with the door closed.  Patient was overhead gagging and spitting.  Had a conversation with patient about going to the cafeteria and trying to eat a little.  She became hysterical and started hyperventilating.  Advised patient to deep breathe and she stayed back from eating.  Tray brought back for patient.  NP advised continue to take BPs and leave fluid and food by her bed.

## 2014-09-08 NOTE — [facility] Group Notes (Signed)
[facility] Group Notes:  (Clinical Social Work)  09/08/2014     10-11AM  Summary of Progress/Problems:   The main focus of today's process group was to learn how to use a decisional balance exercise to move forward in the Stages of Change, which were described and discussed.  Motivational Interviewing and a worksheet were utilized to help patients explore in depth the perceived benefits and costs of a self-sabotaging behavior, as well as the  benefits and costs of replacing that with a healthy coping mechanism.   The patient expressed little in group, was in and out constantly, finally stayed out.  She stated her healthy coping skill is talking about what is going on.  Her unhealthy one is isolation, and she also will direct her anger at others.  Type of Therapy:  Group Therapy - Process   Participation Level:  Minimal  Participation Quality:  Inattentive  Affect:  Depressed and Flat  Cognitive:  N/A  Insight:  Limited  Engagement in Therapy:  Limited  Modes of Intervention:  Education, Motivational Interviewing  Ambrose MantleMareida Grossman-Orr, LCSW 09/08/2014, 1:16 PM

## 2014-09-08 NOTE — Progress Notes (Signed)
Patient complains of dizziness and is unsteady on her feet.  Took BP 100/51.  Advised patient to stay back from lunch and pushed fluids.

## 2014-09-08 NOTE — Plan of Care (Signed)
Problem: Alteration in mood Goal: LTG-Patient reports reduction in suicidal thoughts (Patient reports reduction in suicidal thoughts and is able to verbalize a safety plan for whenever patient is feeling suicidal)  Outcome: Progressing Pt denied SI this shift and verbally contracted for safety.       

## 2014-09-08 NOTE — Progress Notes (Signed)
Patient refused ensure this morning stating, "I ate some breakfast.  I don't need it."  Patient came to nurse later and stated she threw up.  Patient had clear emesis.  When nurse made observation that it was clear, she stated, "well I didn't eat any breakfast this morning."  Concern is that she may have an eating disorder and unclear whether she is attempting to make herself throw up.

## 2014-09-08 NOTE — Progress Notes (Signed)
D: Pt has anxious affect and mood.  Pt reports her day was "not good, been throwing up all day."  Pt reports her goal was "to eat something and not have that happen."  Pt reports decreased appetite.  Pt was eating Doritos in the day room.  Pt reports that today she has been "irritated easily."  Pt denies SI/HI, denies hallucinations.  Pt has been visible in milieu interacting with peers.  Pt attended evening group.   A: Met with pt 1:1 and provided support and encouragement.  Actively listened to pt.  Medications administered per order.  PRN medication administered for pain and sleep.  Pt did not report any episodes of emesis this shift.   R: Pt is compliant with medications.  Pt verbally contracts for safety.  Will continue to monitor and assess.

## 2014-09-08 NOTE — [facility] Group Notes (Signed)
[facility] Group Notes:  (Nursing/MHT/Case Management/Adjunct)  Date:  09/08/2014  Time:  4:42 PM  Type of Therapy:  Psychoeducational Skills  Participation Level:  Active  Participation Quality:  Appropriate  Affect:  Appropriate  Cognitive:  Appropriate  Insight:  Appropriate  Engagement in Group:  Engaged  Modes of Intervention:  Discussion  Summary of Progress/Problems: Pt did attend self inventory group, pt reported that she was negative HI, no AH/VH noted. Pt reported that she was passive SI, and was able to contract for safety. Pt rated her depression as a 9, and her helplessness/hopelessness as a 9.     Pt reported concerns about not eating, N/V, and not sleeping, pt advised that the doctor will be made aware. Mary NP made aware new orders noted.   Jacquelyne BalintForrest, [place] Moyd Shanta 09/08/2014, 4:42 PM

## 2014-09-08 NOTE — Progress Notes (Signed)
[facility] MD Progress Note  09/08/2014 11:45 AM Emily Hansen  MRN:  [id number]    Subjective:    Continues to c/o  physical aches, and pains, primarily back pain, which she feels may be related to the hospital bed . She also complains of GERD type symptoms, such as heartburn, nausea, occasional vomiting , and report zero po intake Rates Depression    8  /10  and   Anxiety   10/10     She is tearful             Denies medication side effects.  Speaks to poor social support no friends, recent breakup with BF and only her grandmother is a support  She is going to some groups. TSH WNL.    Principal Problem: Major depressive disorder, recurrent, severe without psychotic features Diagnosis:   Patient Active Problem List   Diagnosis Date Noted  . Major depressive disorder, recurrent, severe without psychotic features [F33.2]   . Cannabis dependence with cannabis-induced anxiety disorder [F12.280]   . Bipolar 1 disorder, manic, moderate [F31.12] 09/05/2014   Total Time spent with patient: 25 minutes    Past Medical History:  Past Medical History  Diagnosis Date  . Bipolar disorder    History reviewed. No pertinent past surgical history. Family History: History reviewed. No pertinent family history. Social History:  History  Alcohol Use  . Yes    Comment: has not drank in 2 weeks, social drinker     History  Drug Use  . Yes  . Special: Marijuana    Comment: daily use    History   Social History  . Marital Status: Single    Spouse Name: N/A  . Number of Children: N/A  . Years of Education: N/A   Social History Main Topics  . Smoking status: Never Smoker   . Smokeless tobacco: Not on file  . Alcohol Use: Yes     Comment: has not drank in 2 weeks, social drinker  . Drug Use: Yes    Special: Marijuana     Comment: daily use  . Sexual Activity: Not on file   Other Topics Concern  . None   Social History Narrative   Additional History:    Sleep: Fair  Appetite:   Fair   Assessment:   Musculoskeletal: Strength & Muscle Tone: within normal limits Gait & Station: normal Patient leans: N/A   Psychiatric Specialty Exam: Physical Exam  Constitutional: She is oriented to person, place, and time. She appears well-developed and well-nourished.  HENT:  Head: Normocephalic and atraumatic.  Neck: Normal range of motion. Neck supple.  Musculoskeletal: Normal range of motion.  Neurological: She is alert and oriented to person, place, and time.  Skin: Skin is warm and dry.    ROS  Blood pressure 104/78, pulse 110, temperature 98.2 F (36.8 C), temperature source Oral, resp. rate 16, height  (1.727 m), weight 74.39 kg (164 lb), last menstrual period 09/05/2014.Body mass index is 24.94 kg/(m^2).  Appearance: moderately groomed  Patent attorney::  Fair  Speech:  Normal Rate  Volume:  Normal  Mood:  depressed, but seems somewhat improved compared to admission tearful  Affect:  Constricted  Thought Process:  Goal Directed and Linear  Orientation:  Full (Time, Place, and Person)  Thought Content:  denies hallucinations, no delusions, not internally preoccupied   Suicidal Thoughts:  No at this time denies any thoughts of hurting self and  contracts for safety on unit  Homicidal Thoughts:  No  Memory:   Recent and remote grossly intact   Judgement:  Fair  Insight:  Fair  Psychomotor Activity:  Normal  Concentration:  Good  Recall:  Good  Fund of Knowledge:Good  Language: Good  Akathisia:  Negative  Handed:  Right  AIMS (if indicated):     Assets:  Communication Skills Desire for Improvement Physical Health Social Support Vocational/Educational  ADL's:  improving  Cognition: WNL  Sleep:  Number of Hours: 6     Current Medications: Current Facility-Administered Medications  Medication Dose Route Frequency Provider Last Rate Last Dose  . acetaminophen (TYLENOL) tablet 650 mg  650 mg Oral Q6H PRN Kerry HoughSpencer E Simon, PA-C   650 mg at 09/08/14  0731  . alum & mag hydroxide-simeth (MAALOX/MYLANTA) 200-200-20 MG/5ML suspension 30 mL  30 mL Oral Q4H PRN Kerry HoughSpencer E Simon, PA-C   30 mL at 09/07/14 1429  . buPROPion (WELLBUTRIN XL) 24 hr tablet 150 mg  150 mg Oral Daily Craige CottaFernando A Cobos, MD   150 mg at 09/08/14 0731  . drospirenone-ethinyl estradiol (YAZ,GIANVI,LORYNA) 3-0.02 MG per tablet 1 tablet  1 tablet Oral Daily Kerry HoughSpencer E Simon, PA-C   1 tablet at 09/08/14 0800  . feeding supplement (RESOURCE BREEZE) (RESOURCE BREEZE) liquid 1 Container  1 Container Oral BID BM Normand SloopHaley H Lawrence, RD   1 Container at 09/07/14 1429  . gabapentin (NEURONTIN) capsule 100 mg  100 mg Oral TID Craige CottaFernando A Cobos, MD   100 mg at 09/08/14 1133  . hydrOXYzine (ATARAX/VISTARIL) tablet 25 mg  25 mg Oral Q6H PRN Kerry HoughSpencer E Simon, PA-C   25 mg at 09/08/14 0731  . magnesium hydroxide (MILK OF MAGNESIA) suspension 30 mL  30 mL Oral Daily PRN Kerry HoughSpencer E Simon, PA-C      . pantoprazole (PROTONIX) EC tablet 20 mg  20 mg Oral Daily Craige CottaFernando A Cobos, MD   20 mg at 09/08/14 0731  . sucralfate (CARAFATE) tablet 1 g  1 g Oral TID WC & HS Craige CottaFernando A Cobos, MD   1 g at 09/08/14 1133  . traZODone (DESYREL) tablet 50 mg  50 mg Oral QHS PRN Craige CottaFernando A Cobos, MD   50 mg at 09/07/14 2208    Lab Results:  Results for orders placed or performed during the hospital encounter of 09/05/14 (from the past 48 hour(s))  TSH     Status: None   Collection Time: 09/07/14  6:23 AM  Result Value Ref Range   TSH 4.037 0.350 - 4.500 uIU/mL    Comment[facility]    Physical Findings: AIMS: Facial and Oral Movements Muscles of Facial Expression: None, normal Lips and Perioral Area: None, normal Jaw: None, normal Tongue: None, normal,Extremity Movements Upper (arms, wrists, hands, fingers): None, normal Lower (legs, knees, ankles, toes): None, normal, Trunk Movements Neck, shoulders, hips: None, normal, Overall Severity Severity of abnormal movements (highest  score from questions above): None, normal Incapacitation due to abnormal movements: None, normal Patient's awareness of abnormal movements (rate only patient's report): No Awareness, Dental Status Current problems with teeth and/or dentures?: No Does patient usually wear dentures?: No  CIWA:    COWS:      Assessment- patient remains depressed, but presents with some improvement and is not suicidal or psychotic. Some somatic focus/preoccupations, and reports GERD type symptoms and chronic back pain, worsened by current hospital bed. Tolerating medications well thus far.  Treatment Plan Summary: Daily contact with patient to assess  and evaluate symptoms and progress in treatment, Medication management, Plan continue inpatient treatment and continue medication management as below  Neurontin  100 mgrs TID for pain and anxiety Wellbutrin XL 150 mgrs QDAY for depression Start Trazodone 100 mgrs QHS PRN Insomnia Increase Protonix 40 mg  and continue Carafate  Zofram 4 mg every 8 hrs prn for nausea  CMET in am Nutrition consult/known h/o eating disorder  Medical Decision Making:  Established Problem, Stable/Improving (1), Review of Psycho-Social Stressors (1), Review or order clinical lab tests (1), Review of Medication Regimen & Side Effects (2) and Review of New Medication or Change in Dosage (2)     Hansen, Emily  PMHNP 09/08/2014, 11:45 AM I agree with assessment and plan Madie Reno A. Dub Mikes, M.D.

## 2014-09-08 NOTE — Progress Notes (Signed)
D: Patient continues to complain of nausea.  She complains of decreased appetite and generalized body aches.  Patient rates her depression and hopelessness as a 6; she rates her anxiety as an 8.  She presents with sad, depressed mood.  She denies SI/HI/AVH.  Patient gagged several times while taking her medications.  Her goal today is "to get rid of my nausea and see a doctor."  She has minimal interaction with staff and others. A: Continue to monitor medication management and MD orders.  Safety checks completed every 15 minutes per protocol.  Meet 1:1 with patient to discuss concerns and offer encouragement. R: Patient's behavior is appropriate to situation.

## 2014-09-08 NOTE — Progress Notes (Signed)
Gave pt zofran 4mg  as ordered at 1513 which pt reported relief and was able to eat dinner.  She continues to voice no complaints and it is now 1850.

## 2014-09-08 NOTE — Progress Notes (Signed)
Adult Psychoeducational Group Note  Date:  09/08/2014 Time:  9:29 PM  Group Topic/Focus:  Wrap-Up Group:   The focus of this group is to help patients review their daily goal of treatment and discuss progress on daily workbooks.  Participation Level:  Active  Participation Quality:  Attentive  Affect:  Appropriate  Cognitive:  Appropriate  Insight: Appropriate  Engagement in Group:  Engaged  Modes of Intervention:  Discussion  Additional Comments:  Pt stated her goal for today was to eat without throwing up, which she was able to do. Pt stated she wanted to try writing more and exercising as a coping skill.  Caswell CorwinOwen, Annibelle Brazie C 09/08/2014, 9:29 PM

## 2014-09-09 LAB — COMPREHENSIVE METABOLIC PANEL
ALBUMIN: 4.6 g/dL (ref 3.5–5.2)
ALK PHOS: 36 U/L — AB (ref 39–117)
ALT: 27 U/L (ref 0–35)
AST: 25 U/L (ref 0–37)
Anion gap: 8 (ref 5–15)
BILIRUBIN TOTAL: 0.5 mg/dL (ref 0.3–1.2)
BUN: 6 mg/dL (ref 6–23)
CO2: 30 mmol/L (ref 19–32)
CREATININE: 0.79 mg/dL (ref 0.50–1.10)
Calcium: 9.2 mg/dL (ref 8.4–10.5)
Chloride: 99 mmol/L (ref 96–112)
GFR calc non Af Amer: >90 mL/min (ref >90)
Glucose, Bld: 98 mg/dL (ref 70–99)
Potassium: 3.6 mmol/L (ref 3.5–5.1)
Sodium: 137 mmol/L (ref 135–145)
Total Protein: 7.4 g/dL (ref 6.0–8.3)

## 2014-09-09 MED ORDER — ENSURE PUDDING PO PUDG
1.0000 | Freq: Three times a day (TID) | ORAL | Status: DC
  Administered 2014-09-09 – 2014-09-11 (×4): 1 via ORAL
  Filled 2014-09-09 (×12): qty 1

## 2014-09-09 NOTE — [facility] Group Notes (Signed)
[facility] Group Notes:  (Clinical Social Work)  09/09/2014  10:00-11:00AM  Summary of Progress/Problems:   The main focus of today's process group was to   1)  discuss the importance of adding supports  2)  define health supports versus unhealthy supports  3)  identify the patient's current unhealthy supports and plan how to handle them  4)  Identify the patient's current healthy supports and plan what to add.  An emphasis was placed on using counselor, doctor, therapy groups, 12-step groups, and problem-specific support groups to expand supports.    The patient expressed full comprehension of the concepts presented, and agreed that there is a need to add more supports.  The patient stated that her puppy and her grandmother are her healthy supports while people in her life who disrespect the fact that she has an illness, plus her roommate, are very unhealthy supports.  Her roommate will not clean up after herself, and pt has finally established boundaries where she will not clean after roommate, but will instead put that person's detritus outside of roommate's door.  She stated the roommate situation will end in a few months, as lease is up.  In the meantime she has set boundaries.  Pt was insightful about the anger she experiences when people are dismissive to her about her feelings/illness.  Type of Therapy:  Process Group with Motivational Interviewing  Participation Level:  Active  Participation Quality:  Attentive and Supportive  Affect:  Angry, Appropriate and Depressed  Cognitive:  Alert, Appropriate and Oriented  Insight:  Engaged  Engagement in Therapy:  Engaged  Modes of Intervention:   Education, Support and Processing, Activity  Pilgrim's PrideMareida Grossman-Orr, LCSW 09/09/2014, 12:15pm

## 2014-09-09 NOTE — Progress Notes (Signed)
Patient ID: Emily Hansen, female   DOB: 05/13/1996, 19 y.o.   MRN: [id number] D)  Has been pleasant and cooperative this evening, up and about in the dayroom, attended group, got a bath afterward and washed her hair.  Stated is feeling better this evening, denies SI,  Interacting appropriately with staff and select peers, denies thought of self harm. A)  Will continue to monitor for safety, continue POC R)  Safety maintained.

## 2014-09-09 NOTE — Progress Notes (Addendum)
[facility] MD Progress Note  09/09/2014 11:12 AM Emily Hansen  MRN:  [id number]    Subjective:     Reports feeling "so much better", nausea has resolved and she ate at breakfast.  Continues to c/o  physical aches, and pains, primarily back pain, which she feels may be related to the hospital bed.    Rates Depression 5/10 and Anxiety 5/10.  Denies SI ideations                 Denies medication side effects.    Principal Problem: Major depressive disorder, recurrent, severe without psychotic features Diagnosis:   Patient Active Problem List   Diagnosis Date Noted  . Major depressive disorder, recurrent, severe without psychotic features [F33.2]   . Cannabis dependence with cannabis-induced anxiety disorder [F12.280]   . Bipolar 1 disorder, manic, moderate [F31.12] 09/05/2014   Total Time spent with patient: 25 minutes    Past Medical History:  Past Medical History  Diagnosis Date  . Bipolar disorder    History reviewed. No pertinent past surgical history. Family History: History reviewed. No pertinent family history. Social History:  History  Alcohol Use  . Yes    Comment: has not drank in 2 weeks, social drinker     History  Drug Use  . Yes  . Special: Marijuana    Comment: daily use    History   Social History  . Marital Status: Single    Spouse Name: N/A  . Number of Children: N/A  . Years of Education: N/A   Social History Main Topics  . Smoking status: Never Smoker   . Smokeless tobacco: Not on file  . Alcohol Use: Yes     Comment: has not drank in 2 weeks, social drinker  . Drug Use: Yes    Special: Marijuana     Comment: daily use  . Sexual Activity: Not on file   Other Topics Concern  . None   Social History Narrative   Additional History:    Sleep: Good  Appetite: Good  Assessment:   Musculoskeletal: Strength & Muscle Tone: within normal limits Gait & Station: normal Patient leans: N/A   Psychiatric Specialty Exam: Physical Exam   Constitutional: She is oriented to person, place, and time. She appears well-developed and well-nourished.  HENT:  Head: Normocephalic and atraumatic.  Neck: Normal range of motion. Neck supple.  Musculoskeletal: Normal range of motion.  Neurological: She is alert and oriented to person, place, and time.  Skin: Skin is warm and dry.    ROS  Blood pressure 116/80, pulse 127, temperature 98.5 F (36.9 C), temperature source Oral, resp. rate 16, height _0  (1.727 m), weight 74.39 kg (164 lb), last menstrual period 09/05/2014.Body mass index is 24.94 kg/(m^2).  Appearance: moderately groomed  Engineer, water::  Fair  Speech:  Normal Rate  Volume:  Normal  Mood: depressed but improving   Affect: calm and cooperative  Thought Process:  Goal Directed and Linear  Orientation:  Full (Time, Place, and Person)  Thought Content:  denies hallucinations, no delusions, not internally preoccupied   Suicidal Thoughts:  No at this time denies any thoughts of hurting self and  contracts for safety on unit   Homicidal Thoughts:  No  Memory:   Recent and remote grossly intact   Judgement:  Fair  Insight:  Fair  Psychomotor Activity:  Normal  Concentration:  Good  Recall:  Good  Fund of Knowledge:Good  Language: Good  Akathisia:  Negative  Handed:  Right  AIMS (if indicated):     Assets:  Communication Skills Desire for Improvement Physical Health Social Support Vocational/Educational  ADL's:  improving  Cognition: WNL  Sleep:  Number of Hours: 6     Current Medications: Current Facility-Administered Medications  Medication Dose Route Frequency Provider Last Rate Last Dose  . acetaminophen (TYLENOL) tablet 650 mg  650 mg Oral Q6H PRN Laverle Hobby, PA-C   650 mg at 09/08/14 0731  . alum & mag hydroxide-simeth (MAALOX/MYLANTA) 200-200-20 MG/5ML suspension 30 mL  30 mL Oral Q4H PRN Laverle Hobby, PA-C   30 mL at 09/07/14 1429  . buPROPion (WELLBUTRIN XL) 24 hr tablet 150 mg  150 mg Oral  Daily Jenne Campus, MD   150 mg at 09/09/14 0735  . drospirenone-ethinyl estradiol (YAZ,GIANVI,LORYNA) 3-0.02 MG per tablet 1 tablet  1 tablet Oral Daily Laverle Hobby, PA-C   1 tablet at 09/09/14 0739  . feeding supplement (RESOURCE BREEZE) (RESOURCE BREEZE) liquid 1 Container  1 Container Oral BID BM Dorann Ou, RD   1 Container at 09/07/14 1429  . gabapentin (NEURONTIN) capsule 100 mg  100 mg Oral TID Jenne Campus, MD   100 mg at 09/09/14 0735  . hydrOXYzine (ATARAX/VISTARIL) tablet 25 mg  25 mg Oral Q6H PRN Laverle Hobby, PA-C   25 mg at 09/08/14 2236  . magnesium hydroxide (MILK OF MAGNESIA) suspension 30 mL  30 mL Oral Daily PRN Laverle Hobby, PA-C      . ondansetron (ZOFRAN-ODT) disintegrating tablet 4 mg  4 mg Oral Q8H PRN Knox Royalty, NP   4 mg at 09/08/14 1513  . pantoprazole (PROTONIX) EC tablet 40 mg  40 mg Oral Daily Knox Royalty, NP   40 mg at 09/09/14 0735  . sucralfate (CARAFATE) tablet 1 g  1 g Oral TID WC & HS Jenne Campus, MD   1 g at 09/09/14 0735  . traZODone (DESYREL) tablet 100 mg  100 mg Oral QHS PRN Knox Royalty, NP   100 mg at 09/08/14 2236    Lab Results:  Results for orders placed or performed during the hospital encounter of 09/05/14 (from the past 48 hour(s))  Comprehensive metabolic panel     Status: Abnormal   Collection Time: 09/09/14  6:25 AM  Result Value Ref Range   Sodium 137 135 - 145 mmol/L   Potassium 3.6 3.5 - 5.1 mmol/L   Chloride 99 96 - 112 mmol/L   CO2 30 19 - 32 mmol/L   Glucose, Bld 98 70 - 99 mg/dL   BUN 6 6 - 23 mg/dL   Creatinine, Ser 0.79 0.50 - 1.10 mg/dL   Calcium 9.2 8.4 - 10.5 mg/dL   Total Protein 7.4 6.0 - 8.3 g/dL   Albumin 4.6 3.5 - 5.2 g/dL   AST 25 0 - 37 U/L   ALT 27 0 - 35 U/L   Alkaline Phosphatase 36 (L) 39 - 117 U/L   Total Bilirubin 0.5 0.3 - 1.2 mg/dL   GFR calc non Af Amer >90 >90 mL/min   GFR calc Af Amer >90 >90 mL/min    Comment: (NOTE) The eGFR has been calculated using the CKD EPI  equation. This calculation has not been validated in all clinical situations. eGFR's persistently <90 mL/min signify possible Chronic Kidney Disease.    Anion gap 8 5 - 15    Comment: Performed at [facility]  Physical Findings: AIMS: Facial and Oral Movements Muscles of Facial Expression: None, normal Lips and Perioral Area: None, normal Jaw: None, normal Tongue: None, normal,Extremity Movements Upper (arms, wrists, hands, fingers): None, normal Lower (legs, knees, ankles, toes): None, normal, Trunk Movements Neck, shoulders, hips: None, normal, Overall Severity Severity of abnormal movements (highest score from questions above): None, normal Incapacitation due to abnormal movements: None, normal Patient's awareness of abnormal movements (rate only patient's report): No Awareness, Dental Status Current problems with teeth and/or dentures?: No Does patient usually wear dentures?: No  CIWA:    COWS:      Assessment- patient remains depressed, but presents with some improvement and is not suicidal or psychotic. Some somatic focus/preoccupations, and reports GERD type symptoms and chronic back pain, worsened by current hospital bed. Tolerating medications well thus far.  Treatment Plan Summary:  Daily contact with patient to assess and evaluate symptoms and progress in treatment, Medication management, Plan continue inpatient treatment and continue medication management as below   Continue current meds: Neurontin  100 mgrs TID for pain and anxiety Wellbutrin XL 150 mgrs QDAY for depression Start Trazodone 100 mgrs QHS PRN Insomnia Protonix 40 mg  and continue Carafate  Zofran 4 mg every 8 hrs prn for nausea  Reviewed results of CMET with patient Nutrition consult/known h/o eating disorder  Medical Decision Making:  Established Problem, Stable/Improving (1), Review of Psycho-Social Stressors (1), Review or order clinical lab tests (1), Review of Medication  Regimen & Side Effects (2) and Review of New Medication or Change in Dosage (2)   [place], [place] 09/09/2014, 11:12 AM I agree with assessment and plan Geralyn Flash A. Sabra Heck, M.D. I agree with assessment and plan Woodroe Chen. Sabra Heck, M.D.

## 2014-09-09 NOTE — Plan of Care (Signed)
Problem: Diagnosis: Increased Risk For Suicide Attempt Goal: STG-Patient Will Attend All Groups On The Unit Outcome: Progressing Patient is attending groups and participating in her treatment.

## 2014-09-09 NOTE — Progress Notes (Signed)
Patient ID: [name], female   DOB: 08/18/1995, 19 y.o.   MRN: [id number] D: Patient states that she ate her meal last night and this morning.  Patient states the zofran helped.  She is up in the milieu interacting with others.  She rates her depression and hopelessness as a 5, which is an improvement from yesterday.  She rates her anxiety as a 6.  She denies SI/HI/AVH.  Her goal today is "to follow the program and attend all groups."  She presents with flat affect and sad, depressed mood.  Her eye contact is fair; speech is logical/revelent; thought process clear. A: Continue to monitor medication management and MD orders.  Meet 1:1 with patient to discuss concerns and offer encouragement.  Safety checks completed every 15 minutes per protocol. R: Patient's behavior is appropriate to situation.

## 2014-09-09 NOTE — Progress Notes (Addendum)
12:45pm-report from Caroline-Pt stated she does not like the taste of breeze and wants to have chocolate ensure pudding. Order has been put in and the breeze drink has been D'cd.

## 2014-09-10 DIAGNOSIS — R45851 Suicidal ideations: Secondary | ICD-10-CM

## 2014-09-10 MED ORDER — QUETIAPINE FUMARATE 50 MG PO TABS
50.0000 mg | ORAL_TABLET | Freq: Every day | ORAL | Status: DC
  Administered 2014-09-10: 50 mg via ORAL
  Filled 2014-09-10 (×3): qty 1

## 2014-09-10 MED ORDER — ENSURE ENLIVE PO LIQD
237.0000 mL | Freq: Two times a day (BID) | ORAL | Status: DC
  Administered 2014-09-10 – 2014-09-11 (×3): 237 mL via ORAL

## 2014-09-10 MED ORDER — GABAPENTIN 100 MG PO CAPS
200.0000 mg | ORAL_CAPSULE | Freq: Two times a day (BID) | ORAL | Status: DC
  Administered 2014-09-11: 200 mg via ORAL
  Filled 2014-09-10 (×5): qty 2

## 2014-09-10 NOTE — Progress Notes (Signed)
Recreation Therapy Notes  Date: 04.25.2016 Time: 9:30am Location: [address] Dayroom   Group Topic: Stress Management  Goal Area(s) Addresses:  Patient will actively participate in stress management techniques presented during session.   Behavioral Response: Engaged, Appropriate   Intervention: Stress management techniques  Activity :  Deep Breathing and Guided Visualization. LRT provided instruction and demonstration on practice of Guided Visualization. Technique was coupled with deep breathing.   Education:  Stress Management, Discharge Planning.   Education Outcome: Acknowledges education  Clinical Observations/Feedback: Patient actively participated in presenting technique, patient displayed no difficulty and expressed ability to practice post d/c.    Marykay Lexenise L [place] Freshour, LRT/CTRS  Aundray Cartlidge L 09/10/2014 1:55 PM

## 2014-09-10 NOTE — Progress Notes (Signed)
D: Patient's affect appropriate to circumstance and mood is anxious. She reported on the self inventory sheet that both sleep and appetite are fair, low energy level and poor ability to concentrate. Patient rates depression/feelings of hopelessness "5" and anxiety "9". She's participating in group sessions, interactive with peers on the hall and visible in the milieu. Patient adheres to the medication regimen.  A: Support and encouragement provided to patient. Administered medications per ordering MD. Monitor Q15 minute checks for safety.  R: Patient receptive. Denies SI/HI/AVH. Patient remains safe on the unit.

## 2014-09-10 NOTE — Progress Notes (Signed)
Patient ID: Emily Hansen, female   DOB: [birth year], 19 y.o.   MRN: [id number] [facility] MD Progress Note  09/10/2014 5:13 PM Emily Hansen  MRN:  [id number]    Subjective:    States she feels better than upon admission, but states she still feels depressed and in particular anxious. Also reports insomnia as an ongoing issue. States insomnia has been a problem for months, and that " that is why I smoke marihuana, to go to sleep". Still feels depressed, but better than upon admission. Describes short term mood swings, usually of short duration. Describes some ongoing nausea, but denies vomiting . States appetite is fair, and has lost some unspecified weight. Denies any active purging or bulimia, anorexia, and states fair appetite is simply due to nausea. Does state PPI and Carafate have helped .   Objective : I have discussed case with treatment team. Patient has reported ongoing anxiety, some depression and passive SI. She has been pleasant upon approach. No disruptive or agitated behaviors on unit. Visible in day room, interacting with peers. No medication side effects reported. As noted, describes some ongoing depression, and stresses insomnia as major symptom- also chronic. Basic Metabolic Panel unremarkable       Principal Problem: Major depressive disorder, recurrent, severe without psychotic features Diagnosis:   Patient Active Problem List   Diagnosis Date Noted  . Major depressive disorder, recurrent, severe without psychotic features [F33.2]   . Cannabis dependence with cannabis-induced anxiety disorder [F12.280]   . Bipolar 1 disorder, manic, moderate [F31.12] 09/05/2014   Total Time spent with patient: 25 minutes    Past Medical History:  Past Medical History  Diagnosis Date  . Bipolar disorder    History reviewed. No pertinent past surgical history. Family History: History reviewed. No pertinent family history. Social History:  History  Alcohol Use  . Yes    Comment:  has not drank in 2 weeks, social drinker     History  Drug Use  . Yes  . Special: Marijuana    Comment: daily use    History   Social History  . Marital Status: Single    Spouse Name: N/A  . Number of Children: N/A  . Years of Education: N/A   Social History Main Topics  . Smoking status: Never Smoker   . Smokeless tobacco: Not on file  . Alcohol Use: Yes     Comment: has not drank in 2 weeks, social drinker  . Drug Use: Yes    Special: Marijuana     Comment: daily use  . Sexual Activity: Not on file   Other Topics Concern  . None   Social History Narrative   Additional History:    Sleep: As per nursing report slept well, but patient states sleep is actually poor and lies awake in bed a lot .  Appetite:  Fair, describes frequent nausea.  Assessment:   Musculoskeletal: Strength & Muscle Tone: within normal limits Gait & Station: normal Patient leans: N/A   Psychiatric Specialty Exam: Physical Exam  Constitutional: She is oriented to person, place, and time. She appears well-developed and well-nourished.  HENT:  Head: Normocephalic and atraumatic.  Neck: Normal range of motion. Neck supple.  Musculoskeletal: Normal range of motion.  Neurological: She is alert and oriented to person, place, and time.  Skin: Skin is warm and dry.    Review of Systems  Constitutional: Positive for weight loss. Negative for fever and chills.  Eyes: Negative.   Respiratory: Negative  for cough and shortness of breath.   Cardiovascular: Negative for chest pain.  Gastrointestinal: Positive for heartburn, nausea and constipation. Negative for vomiting, abdominal pain, diarrhea, blood in stool and melena.  Genitourinary: Negative for dysuria, urgency and frequency.  Musculoskeletal: Negative.   Skin: Negative for rash.  Neurological: Negative for seizures and headaches.  Endo/Heme/Allergies: Negative.   Psychiatric/Behavioral: Positive for depression. The patient is  nervous/anxious and has insomnia.     Blood pressure 120/47, pulse 108, temperature 98 F (36.7 C), temperature source Oral, resp. rate 18, height 5' 8" (1.727 m), weight 164 lb (74.39 kg), last menstrual period 09/05/2014.Body mass index is 24.94 kg/(m^2).  Appearance:  Fairly groomed   Engineer, water::   Good   Speech:  Normal Rate  Volume:  Normal  Mood: depressed but improving   Affect:  Appropriate, somewhat anxious , improves with reassurance, support  Thought Process:  Goal Directed and Linear  Orientation:  Full (Time, Place, and Person)  Thought Content:  denies hallucinations, no delusions, not internally preoccupied   Suicidal Thoughts:  Yes.  without intent/plan- describes occasional passive SI, but  at this time denies any thoughts of hurting self and  contracts for safety on unit   Homicidal Thoughts:  No  Memory:   Recent and remote grossly intact   Judgement:  Fair  Insight:  Fair  Psychomotor Activity:  Normal  Concentration:  Good  Recall:  Good  Fund of Knowledge:Good  Language: Good  Akathisia:  Negative  Handed:  Right  AIMS (if indicated):     Assets:  Communication Skills Desire for Improvement Physical Health Social Support Vocational/Educational  ADL's:  improving  Cognition: WNL  Sleep:  Number of Hours: 6.75     Current Medications: Current Facility-Administered Medications  Medication Dose Route Frequency Provider Last Rate Last Dose  . acetaminophen (TYLENOL) tablet 650 mg  650 mg Oral Q6H PRN Laverle Hobby, PA-C   650 mg at 09/10/14 4562  . alum & mag hydroxide-simeth (MAALOX/MYLANTA) 200-200-20 MG/5ML suspension 30 mL  30 mL Oral Q4H PRN Laverle Hobby, PA-C   30 mL at 09/07/14 1429  . buPROPion (WELLBUTRIN XL) 24 hr tablet 150 mg  150 mg Oral Daily Jenne Campus, MD   150 mg at 09/10/14 0802  . drospirenone-ethinyl estradiol (YAZ,GIANVI,LORYNA) 3-0.02 MG per tablet 1 tablet  1 tablet Oral Daily Laverle Hobby, PA-C   1 tablet at 09/10/14  0802  . feeding supplement (ENSURE ENLIVE) (ENSURE ENLIVE) liquid 237 mL  237 mL Oral BID BM Clayton Bibles, RD   237 mL at 09/10/14 1448  . feeding supplement (ENSURE) (ENSURE) pudding 1 Container  1 Container Oral TID BM Encarnacion Slates, NP   1 Container at 09/10/14 1449  . gabapentin (NEURONTIN) capsule 100 mg  100 mg Oral TID Jenne Campus, MD   100 mg at 09/10/14 1641  . hydrOXYzine (ATARAX/VISTARIL) tablet 25 mg  25 mg Oral Q6H PRN Laverle Hobby, PA-C   25 mg at 09/09/14 2226  . magnesium hydroxide (MILK OF MAGNESIA) suspension 30 mL  30 mL Oral Daily PRN Laverle Hobby, PA-C      . ondansetron (ZOFRAN-ODT) disintegrating tablet 4 mg  4 mg Oral Q8H PRN Knox Royalty, NP   4 mg at 09/10/14 0701  . pantoprazole (PROTONIX) EC tablet 40 mg  40 mg Oral Daily Knox Royalty, NP   40 mg at 09/10/14 0802  . sucralfate (CARAFATE) tablet 1  g  1 g Oral TID WC & HS Jenne Campus, MD   1 g at 09/10/14 1641  . traZODone (DESYREL) tablet 100 mg  100 mg Oral QHS PRN Knox Royalty, NP   100 mg at 09/09/14 2227    Lab Results:  Results for orders placed or performed during the hospital encounter of 09/05/14 (from the past 48 hour(s))  Comprehensive metabolic panel     Status: Abnormal   Collection Time: 09/09/14  6:25 AM  Result Value Ref Range   Sodium 137 135 - 145 mmol/L   Potassium 3.6 3.5 - 5.1 mmol/L   Chloride 99 96 - 112 mmol/L   CO2 30 19 - 32 mmol/L   Glucose, Bld 98 70 - 99 mg/dL   BUN 6 6 - 23 mg/dL   Creatinine, Ser 0.79 0.50 - 1.10 mg/dL   Calcium 9.2 8.4 - 10.5 mg/dL   Total Protein 7.4 6.0 - 8.3 g/dL   Albumin 4.6 3.5 - 5.2 g/dL   AST 25 0 - 37 U/L   ALT 27 0 - 35 U/L   Alkaline Phosphatase 36 (L) 39 - 117 U/L   Total Bilirubin 0.5 0.3 - 1.2 mg/dL   GFR calc non Af Amer >90 >90 mL/min   GFR calc Af Amer >90 >90 mL/min    Comment: (NOTE) The eGFR has been calculated using the CKD EPI equation. This calculation has not been validated in all clinical situations. eGFR's  persistently <90 mL/min signify possible Chronic Kidney Disease.    Anion gap 8 5 - 15    Comment: Performed at [facility]    Physical Findings: AIMS: Facial and Oral Movements Muscles of Facial Expression: None, normal Lips and Perioral Area: None, normal Jaw: None, normal Tongue: None, normal,Extremity Movements Upper (arms, wrists, hands, fingers): None, normal Lower (legs, knees, ankles, toes): None, normal, Trunk Movements Neck, shoulders, hips: None, normal, Overall Severity Severity of abnormal movements (highest score from questions above): None, normal Incapacitation due to abnormal movements: None, normal Patient's awareness of abnormal movements (rate only patient's report): No Awareness, Dental Status Current problems with teeth and/or dentures?: No Does patient usually wear dentures?: No  CIWA:    COWS:      Assessment- patient remains  Somewhat depressed, anxious, today emphasizing insomnia as major stressor. Does not feel Trazodone is helping for sleep. Tolerating medications well.  Somatic symptoms reported, particularly GI symptoms. No vomiting. We discussed treatment options- agrees to try Seroquel QHS to address insomnia, night time anxiety, agitation. We reviewed side effects, to include risk of metabolic disturbances and weight gain.  Treatment Plan Summary:  Daily contact with patient to assess and evaluate symptoms and progress in treatment, Medication management, Plan continue inpatient treatment and continue medication management as below   Continue current meds: Increase Neurontin   To 200  mgrs  BID   for pain and anxiety Wellbutrin XL 150 mgrs QDAY for depression D/C Trazodone at this time. Start Seroquel 50 mgrs QHS for insomnia/ mood  Protonix 40 mg  and continue Carafate  Zofran 4 mg every 8 hrs prn for nausea  Medical Decision Making:  Established Problem, Stable/Improving (1), Review of Psycho-Social Stressors (1), Review  or order clinical lab tests (1), Review of Medication Regimen & Side Effects (2) and Review of New Medication or Change in Dosage (2)   ,    09/10/2014, 5:13 PM

## 2014-09-10 NOTE — [facility] Group Notes (Signed)
[facility] LCSW Group Therapy          Overcoming Obstacles       1:15 -2:30        09/10/2014       Type of Therapy:  Group Therapy  Participation Level:  Appropriate  Participation Quality:  Appropriate  Affect:  Appropriate  Cognitive: Appropriate  Insight: Developing/Improving Engaged  Engagement in Therapy: Developing/Imprvoing Engaged  Modes of Intervention:  Discussion Exploration  Education Rapport BuildingProblem-Solving Support  Summary of Progress/Problems:  The main focus of today's group was overcoming obstacles.  She stated the obstacle she has to overcome is her inner self.  Patient shared she has to stop telling herself she can't do things and learn to concentrate and stay focused.    Emily Hansen, Emily Hansen 09/10/2014

## 2014-09-10 NOTE — Progress Notes (Signed)
NUTRITION FOLLOW-UP/CONSULT  RD consulted for pt with H/O eating disorder.   INTERVENTION: 1. Educated patient on the importance of nutrition and encouraged intake of food and beverages. 2. Discussed weight goals. 3. Supplements: Ensure Enlive po BID, each supplement provides 350 kcal and 20 grams of protein  NUTRITION DIAGNOSIS: Inadequate oral intake related to nausea from anxiety as evidenced by weight loss and poor po  Goal: Pt to meet >/= 90% of their estimated nutrition needs.  Monitor:  PO intake  Assessment:  4/21: Pt reports being unable to tolerate po due to anxiety and nausea. She said that she either vomits or feels sick whenever she tries to eat or drink, even water. Attributes this to anxiety. She reports weight gain several months ago due to new birth control, but says that her weight has dropped 12-14 lbs recently. Agreed to try nutritional supplements to improve po intake.  4/25 Spoke with patient about nutritional supplements. Pt states she did not like the [employer]ze supplements but has been eating chocolate pudding as a supplement (equivalent in nutritional content of Ensure pudding).  Pt is willing to try Ensure supplements now that she is eating better. Pt states she is still nauseous and admits to throwing up to make herself feel better. Pt denies having bulimia and purging for weight loss. Pt reports feeling hungry but if she doesn't eat within 5 minutes of feeling hungry she starts to feel nauseous. Pt states she feels like she is sensitive to gluten. Encouraged pt to get tested if she feels she is gluten sensitive.  Encouraged pt to keep eating at each meal and to try the snacks provided. Pt would like fruit as an option for her snack.   Height: Ht Readings from Last 1 Encounters:  09/05/14 5\' 8"  (1.727 m) (93 %*, Z = 1.45)   * Growth percentiles are based on CDC 2-20 Years data.    Weight: Wt Readings from Last 1 Encounters:  09/07/14 164 lb  (74.39 kg) (89 %*, Z = 1.23)   * Growth percentiles are based on CDC 2-20 Years data.    Weight Hx: Wt Readings from Last 10 Encounters:  09/07/14 164 lb (74.39 kg) (89 %*, Z = 1.23)   * Growth percentiles are based on CDC 2-20 Years data.    BMI:  Body mass index is 24.94 kg/(m^2). Pt meets criteria for normal body weight based on current BMI.  Estimated Nutritional Needs: Kcal: 25-30 kcal/kg Protein: > 1 gram protein/kg Fluid: 1 ml/kcal  Diet Order: Diet regular Room service appropriate?: Yes; Fluid consistency:: Thin Pt is also offered choice of unit snacks mid-morning and mid-afternoon.  Pt is eating as desired.   Lab results and medications reviewed.   Tilda FrancoLindsey Kately Graffam, MS, RD, LDN Pager: [phone] After Hours Pager: [phone]

## 2014-09-10 NOTE — [facility] Group Notes (Signed)
[facility]   09/10/2014 10:06 AM    Participation Quality:  Appropraite  Mood/Affect:  Appropriate  Depression Rating:  7  Anxiety Rating:  9  Thoughts of Suicide:  Patient endorses passive SI  Will you contract for safety?   Yes, patient contracts for safety  Current AVH:  No  Plan for Discharge/Comments:  Patient attended discharge planning group and actively participated in group. Patient advised of passive SI.  CSW to follow up with CSW who has been working with patient on discharge plans. Suicide prevention education reviewed and SPE document provided.   Transportation Means: Patient has transportation.   Supports:  Patient has a support system.   Shadia Larose, Joesph JulyQuylle Hairston

## 2014-09-10 NOTE — Progress Notes (Signed)
Patient ID: Pincus SanesOlivia C Carreto, female   DOB: 04/09/1996, 19 y.o.   MRN: [id number] D: Client visible on the unit, reports depression at "5" of 10 and anxiety as "10" of 10 relates it to receiving some "bad news" A: Writer introduced self to client, provided emotional support, reviewed and administered medications as ordered. Staff will monitor q5815min for safety. R: Client is safe on the unit, attended group.

## 2014-09-10 NOTE — Progress Notes (Signed)
Pts goal for the day was to, "feel better and to not want to die".  Pt did not elaborate and affect was flat throughout group.   Tomi BambergerMariya Berna Gitto, MHT

## 2014-09-10 NOTE — Progress Notes (Signed)
Patient ID: [name], female   DOB: 01/07/1996, 19 y.o.   MRN: [id number] D)   Spent most of the evening in the dayroom  with her roommate.  Attended group but not as bright this evening, quieter and less conversational, although is pleasant, and is cooperative and compliant with the program and taking her meds.  Has been giving her roommate support d/t unwanted attention from female peer.  Was wearing her glasses tonight, said was tired and her eyes had been bothering her.  Affect is rather flat, depressed. A)  Will continue to monitor for safety, continue POC R)  Safety maintained.

## 2014-09-11 MED ORDER — GABAPENTIN 100 MG PO CAPS
200.0000 mg | ORAL_CAPSULE | Freq: Three times a day (TID) | ORAL | Status: DC
  Administered 2014-09-11 – 2014-09-12 (×3): 200 mg via ORAL
  Filled 2014-09-11 (×5): qty 2

## 2014-09-11 MED ORDER — BUPROPION HCL ER (XL) 300 MG PO TB24
300.0000 mg | ORAL_TABLET | Freq: Every day | ORAL | Status: DC
  Administered 2014-09-12: 300 mg via ORAL
  Filled 2014-09-11 (×3): qty 1

## 2014-09-11 MED ORDER — QUETIAPINE FUMARATE 25 MG PO TABS
12.5000 mg | ORAL_TABLET | Freq: Three times a day (TID) | ORAL | Status: DC | PRN
  Administered 2014-09-11: 12.5 mg via ORAL
  Filled 2014-09-11: qty 4
  Filled 2014-09-11: qty 1

## 2014-09-11 MED ORDER — QUETIAPINE FUMARATE 100 MG PO TABS
100.0000 mg | ORAL_TABLET | Freq: Every day | ORAL | Status: DC
  Administered 2014-09-11: 100 mg via ORAL
  Filled 2014-09-11 (×3): qty 1

## 2014-09-11 NOTE — Progress Notes (Signed)
Pt attended spiritual care group on grief and loss facilitated by chaplain Burnis KingfisherMatthew Stalnaker and counseling intern Emily Wally Behan. Group opened with brief discussion and psycho-social ed around grief and loss in relationships and in relation to self - identifying life patterns, circumstances, changes that cause losses. Established group norm of speaking from own life experience. Group goal of establishing open and affirming space for members to share loss and experience with grief, normalize grief experience and provide psycho social education and grief support.  Group drew on narrative and Alderian therapeutic modalities.   Emily Hansen was present throughout group and identified with other group members around loss of childhood. Emily Hansen shared her father is diagnosed with BPD, and expressed anger that others did not protect her from him as she would often become very upset from interacting with him. Spoke about mother being "overprotective" now, and she is struggling with understanding her identity as a young adult.   Emily Hansen Counseling Intern

## 2014-09-11 NOTE — Progress Notes (Signed)
Recreation Therapy Notes  Animal-Assisted Activity (AAA) Program Checklist/Progress Notes Patient Eligibility Criteria Checklist & Daily Group note for Rec Tx Intervention  Date: 04.26.2016 Time: 2:45pm Location: [address] Dayroom    AAA/T Program Assumption of Risk Form signed by Patient/ or Parent Legal Guardian yes  Patient is free of allergies or sever asthma yes  Patient reports no fear of animals yes  Patient reports no history of cruelty to animals yes  Patient understands his/her participation is voluntary yes  Patient washes hands before animal contact yes  Patient washes hands after animal contact yes  Behavioral Response: Engaged, Appropriate   Education: Hand Washing, Appropriate Animal Interaction   Education Outcome: Acknowledges education.   Clinical Observations/Feedback: Patient actively engaged in session, interacting appropriately with therapy dog and peers.   [name] L Jeret Goyer, LRT/CTRS  Deyton Ellenbecker L 09/11/2014 4:56 PM 

## 2014-09-11 NOTE — Progress Notes (Signed)
D:  Per pt self inventory pt reports sleeping fair, appetite poor--on ensure shake and pudding, energy level low, ability to pay attention poor, rates depression at 5 out of 10, hopelessness at a 5 out of 10, anxiety at a 7 out of 10, pt denies SI/HI/AVH, anxious/depressed, goal for today is to follow the treatment plan and eat more.      A:  Emotional support provided, Encouraged pt to continue with treatment plan and attend all group activities, q15 min checks maintained for safety.  R:  Pt is receptive, going to groups,  Pleasant and cooperative with staff and other patients.

## 2014-09-11 NOTE — Progress Notes (Signed)
Pt attended wrap-up group stating that her goal was to talk with the MD about getting medications adjusted.  States that she had a good meeting with the psychiatrist today and that she did get her meds adjusted which made her feel good that she was able to speak up for herself for a change.   Emily BambergerMariya Zelena Bushong, MHT

## 2014-09-11 NOTE — Tx Team (Signed)
Interdisciplinary Treatment Plan Update   Date Reviewed:  09/11/2014  Time Reviewed:  9:08 AM  Progress in Treatment:   Attending groups: Yes, patient is attending groups. Participating in groups: Yes, engages in group discussion. Taking medication as prescribed: Yes  Tolerating medication: Yes Family/Significant other contact made:  No, patient declined  collateral contact Patient understands diagnosis: Yes, patient understands diagnosis and need for treatment. Discussing patient identified problems/goals with staff: Yes, patient is able to express goals for treatment and discharge. Medical problems stabilized or resolved: Yes Denies suicidal/homicidal ideation: Yes Patient has not harmed self or others: Yes  For review of initial/current patient goals, please see plan of care.  Estimated Length of Stay:  2-3 days  Reasons for Continued Hospitalization:  Anxiety Depression Medication stabilization  New Problems/Goals identified:    Discharge Plan or Barriers:   Home with outpatient follow up with [facility]  Additional Comments:  Continue medication stabilization Patient and CSW reviewed patient's identified goals and treatment plan.  Patient verbalized understanding and agreed to treatment plan.   Attendees:  Patient:  09/11/2014 9:08 AM   Signature:  Sallyanne HaversF. Cobos, MD 09/11/2014 9:08 AM  Signature: Geoffery LyonsIrving Lugo, MD 09/11/2014 9:08 AM  Signature:  Elliot Cousinngela Thorne, RN  09/11/2014 9:08 AM  Signature: 09/11/2014 9:08 AM  Signature:   09/11/2014 9:08 AM  Signature:  Juline PatchQuylle Jimya Ciani, LCSW 09/11/2014 9:08 AM  Signature:  Belenda CruiseKristin Drinkard, LCSW-A 09/11/2014 9:08 AM  Signature:  Leisa LenzValerie Enoch, Care Coordinator [facility] 09/11/2014 9:08 AM  Signature: 09/11/2014 9:08 AM  Signature: 09/11/2014  9:08 AM  Signature:   Onnie BoerJennifer Clark, RN URCM 09/11/2014  9:08 AM  Signature:   09/11/2014  9:08 AM    Scribe for Treatment Team:   Juline PatchQuylle Millette Halberstam,  09/11/2014 9:08 AM

## 2014-09-11 NOTE — Progress Notes (Addendum)
Patient ID: Emily Hansen, female   DOB: 05/28/1995, 19 y.o.   MRN: [id number] [facility]  09/11/2014 5:44 PM Emily Hansen  MRN:  [id number]    Subjective:    Patient reports ongoing symptoms such as some ongoing depression and anxiety. States anxiety now more significant than depression. As noted, insomnia has been a chronic issue, but states she did sleep " a little better " on Seroquel. Did not have any side effects from medications/. Patient is insightful and motivated in treatment and states she has been reading on mental illnesses and feels she has criteria for Borderline Personality Disorder, and wanted to know more about this. We reviewed the likely benefit of psychotherapy in addition to medication management. Of note, states nausea and reflux symptoms much improved at this time.   Objective : I have discussed case with treatment team.  No disruptive behaviors on unit- reports ongoing depression, short lived mood swings and episodes of irritability, usually lasting only a few minutes. At this time does not present with manic symptoms.  Insomnia long term issue, but partially improved with Seroquel. Slept about 5-6 hours last night. We reviewed medication side effect profile, to include risk of medication induced weight gain and metabolic issues. Patient does not feel that Vistaril PRNs have been particularly helpful for anxiety. We discussed switching to low doses of Seroquel PRNs for anxiety.       Principal Problem: Major depressive disorder, recurrent, severe without psychotic features Diagnosis:   Patient Active Problem List   Diagnosis Date Noted  . Major depressive disorder, recurrent, severe without psychotic features [F33.2]   . Cannabis dependence with cannabis-induced anxiety disorder [F12.280]   . Bipolar 1 disorder, manic, moderate [F31.12] 09/05/2014   Total Time spent with patient: 25 minutes    Past Medical History:  Past Medical History   Diagnosis Date  . Bipolar disorder    History reviewed. No pertinent past surgical history. Family History: History reviewed. No pertinent family history. Social History:  History  Alcohol Use  . Yes    Comment: has not drank in 2 weeks, social drinker     History  Drug Use  . Yes  . Special: Marijuana    Comment: daily use    History   Social History  . Marital Status: Single    Spouse Name: N/A  . Number of Children: N/A  . Years of Education: N/A   Social History Main Topics  . Smoking status: Never Smoker   . Smokeless tobacco: Not on file  . Alcohol Use: Yes     Comment: has not drank in 2 weeks, social drinker  . Drug Use: Yes    Special: Marijuana     Comment: daily use  . Sexual Activity: Not on file   Other Topics Concern  . None   Social History Narrative   Additional History:    Sleep:  Improving   Appetite:  Fair, describes frequent nausea.  Assessment:   Musculoskeletal: Strength & Muscle Tone: within normal limits Gait & Station: normal Patient leans: N/A   Psychiatric Specialty Exam: Physical Exam  Constitutional: She is oriented to person, place, and time. She appears well-developed and well-nourished.  HENT:  Head: Normocephalic and atraumatic.  Neck: Normal range of motion. Neck supple.  Musculoskeletal: Normal range of motion.  Neurological: She is alert and oriented to person, place, and time.  Skin: Skin is warm and dry.    ROS  Blood pressure 100/67,  pulse 105, temperature 98.9 F (37.2 C), temperature source Oral, resp. rate 18, height  (1.727 m), weight 164 lb (74.39 kg), last menstrual period 09/05/2014.Body mass index is 24.94 kg/(m^2).  Appearance:  Improved grooming    Eye Contact::   Good   Speech:  Normal Rate  Volume:  Normal  Mood:  Mood improved, affect more reactive , still anxious   Affect:  Appropriate, somewhat anxious   Thought Process:  Goal Directed and Linear  Orientation:  Full (Time, Place, and  Person)  Thought Content:  denies hallucinations, no delusions, not internally preoccupied   Suicidal Thoughts:  No- at this time denies any thoughts of hurting self and  contracts for safety on unit   Homicidal Thoughts:  No  Memory:   Recent and remote grossly intact   Judgement:  Fair  Insight:  Fair  Psychomotor Activity:  Normal- no psychomotor agitation or restlessness noted   Concentration:  Good  Recall:  Good  Fund of Knowledge:Good  Language: Good  Akathisia:  Negative  Handed:  Right  AIMS (if indicated):     Assets:  Communication Skills Desire for Improvement Physical Health Social Support Vocational/Educational  ADL's:  improving  Cognition: WNL  Sleep:  Number of Hours: 6     Current Medications: Current Facility-Administered Medications  Medication Dose Route Frequency Provider Last Rate Last Dose  . acetaminophen (TYLENOL) tablet 650 mg  650 mg Oral Q6H PRN Kerry Hough, PA-C   650 mg at 09/10/14 1610  . alum & mag hydroxide-simeth (MAALOX/MYLANTA) 200-200-20 MG/5ML suspension 30 mL  30 mL Oral Q4H PRN Kerry Hough, PA-C   30 mL at 09/07/14 1429  . [START ON 09/12/2014] buPROPion (WELLBUTRIN XL) 24 hr tablet 300 mg  300 mg Oral Daily Rockey Situ Cobos, MD      . drospirenone-ethinyl estradiol (YAZ,GIANVI,LORYNA) 3-0.02 MG per tablet 1 tablet  1 tablet Oral Daily Kerry Hough, PA-C   1 tablet at 09/11/14 0816  . feeding supplement (ENSURE ENLIVE) (ENSURE ENLIVE) liquid 237 mL  237 mL Oral BID BM Tilda Franco, RD   237 mL at 09/11/14 1444  . feeding supplement (ENSURE) (ENSURE) pudding 1 Container  1 Container Oral TID BM Sanjuana Kava, NP   1 Container at 09/10/14 2024  . gabapentin (NEURONTIN) capsule 200 mg  200 mg Oral TID Craige Cotta, MD   200 mg at 09/11/14 1658  . magnesium hydroxide (MILK OF MAGNESIA) suspension 30 mL  30 mL Oral Daily PRN Kerry Hough, PA-C      . ondansetron (ZOFRAN-ODT) disintegrating tablet 4 mg  4 mg Oral Q8H PRN Canary Brim, NP   4 mg at 09/10/14 0701  . pantoprazole (PROTONIX) EC tablet 40 mg  40 mg Oral Daily Canary Brim, NP   40 mg at 09/11/14 0816  . QUEtiapine (SEROQUEL) tablet 100 mg  100 mg Oral QHS Fernando A Cobos, MD      . QUEtiapine (SEROQUEL) tablet 12.5 mg  12.5 mg Oral Q8H PRN Craige Cotta, MD   12.5 mg at 09/11/14 1659  . sucralfate (CARAFATE) tablet 1 g  1 g Oral TID WC & HS Craige Cotta, MD   1 g at 09/11/14 1658    Lab Results:  No results found for this or any previous visit (from the past 48 hour(s)).  Physical Findings: AIMS: Facial and Oral Movements Muscles of Facial Expression: None, normal Lips and Perioral Area:  None, normal Jaw: None, normal Tongue: None, normal,Extremity Movements Upper (arms, wrists, hands, fingers): None, normal Lower (legs, knees, ankles, toes): None, normal, Trunk Movements Neck, shoulders, hips: None, normal, Overall Severity Severity of abnormal movements (highest score from questions above): None, normal Incapacitation due to abnormal movements: None, normal Patient's awareness of abnormal movements (rate only patient's report): No Awareness, Dental Status Current problems with teeth and/or dentures?: No Does patient usually wear dentures?: No  CIWA:    COWS:      Assessment- patient reports some improvement and at this time mood and affect appear improved. She does report ongoing depression, although better, and some ongoing anxiety. She slept better on Seroquel , which thus far has been well tolerated..  Treatment Plan Summary:  Daily contact with patient to assess and evaluate symptoms and progress in treatment, Medication management, Plan continue inpatient treatment and continue medication management as below   Continue current meds: Increase Neurontin   To 200  mgrs   TID    for pain and anxiety Increase Wellbutrin XL  To 300 mgrs QDAY for depression Increase Seroquel to  100 mgrs QHS for insomnia/ mood  Start Seroquel 12.5  mgrs Q 8 hours PRN Anxiety D/C Atarax/Vistaril PRNs  Protonix 40 mg  and continue Carafate  Zofran 4 mg every 8 hrs prn for nausea  Medical Decision Making:  Established Problem, Stable/Improving (1), Review of Psycho-Social Stressors (1), Review or order clinical lab tests (1), Review of Medication Regimen & Side Effects (2) and Review of New Medication or Change in Dosage (2)   COBOS, FERNANDO   09/11/2014, 5:44 PM

## 2014-09-11 NOTE — Progress Notes (Signed)
D: Client in dayroom playing monopoly, reports her goal today was discharge. "I've done the therapy meetings and to redo thing would just be repeating them" "I mean it's been a great experience but I'm ready to go home" Client reports she has post discharge plans to see a therapist, also "new routine taking my medications, working out, and getting toxic people and things out of my life" A: Clinical research associateWriter provided emotional support encouraged client to follow through with plans to see therapist and maintain medications regime upon discharge. Staff will monitor q1815min for safety. R:Client is safe on the unit, attended group.

## 2014-09-11 NOTE — [facility] Group Notes (Signed)
[facility] LCSW Group Therapy      Feelings About Diagnosis 1:15 - 2:30 PM         09/11/2014 2:37 PM    Type of Therapy:  Group Therapy  Participation Level:  Active  Participation Quality:  Appropriate  Affect:  Appropriate  Cognitive:  Alert and Appropriate  Insight:  Developing/Improving and Engaged  Engagement in Therapy:  Developing/Improving and Engaged  Modes of Intervention:  Discussion, Education, Exploration, Problem-Solving, Rapport Building, Support  Summary of Progress/Problems:  Patient actively participated in group. Patient discussed past and present diagnosis and the effects it has had on  life.  She stated for her it feels good to have a right diagnosis.  She stated it is still difficult to see how others react when they hear you have a mental health diagnosis  Emily Hansen, Emily Hansen 09/11/2014  2:37 PM

## 2014-09-11 NOTE — [facility] Group Notes (Signed)
Adult Psychoeducational Group Note  Date:  09/11/2014 Time:  0920 am  Group Topic/Focus:  Goals Group:   The focus of this group is to help patients establish daily goals to achieve during treatment and discuss how the patient can incorporate goal setting into their daily lives to aide in recovery. Orientation:   The focus of this group is to educate the patient on the purpose and policies of crisis stabilization and provide a format to answer questions about their admission.  The group details unit policies and expectations of patients while admitted.  Participation Level:  Minimal  Participation Quality:  Inattentive and Sharing  Affect:  Anxious and Appropriate  Cognitive:  Alert and Appropriate  Insight: Good and Improving  Engagement in Group:  Distracting and Engaged  Modes of Intervention:  Discussion, Education, Limit-setting and Orientation  Additional Comments:  Pt was present for the entire group time, however pt was engaged in side conversation for some parts of the group, pt did share that her goal for today is to "get my moods under control" and pt shared that her healthy coping skill is to listen to music.  Alfonse Sprucehorne, Ranbir Chew Brooke 09/11/2014, 11:02 AM

## 2014-09-11 NOTE — Plan of Care (Signed)
Problem: Alteration in mood Goal: LTG-Patient reports reduction in suicidal thoughts (Patient reports reduction in suicidal thoughts and is able to verbalize a safety plan for whenever patient is feeling suicidal)  Outcome: Progressing Client reports decreased thought of suicide "not right now", safe on the unit with q7215min safety checks and with verbal contract for safety.

## 2014-09-12 ENCOUNTER — Encounter (HOSPITAL_COMMUNITY): Payer: Self-pay | Admitting: Registered Nurse

## 2014-09-12 DIAGNOSIS — F3112 Bipolar disorder, current episode manic without psychotic features, moderate: Secondary | ICD-10-CM | POA: Insufficient documentation

## 2014-09-12 MED ORDER — PANTOPRAZOLE SODIUM 40 MG PO TBEC: 40.0000 mg | DELAYED_RELEASE_TABLET | Freq: Every day | ORAL | Status: DC

## 2014-09-12 MED ORDER — QUETIAPINE FUMARATE 100 MG PO TABS: 100.0000 mg | ORAL_TABLET | Freq: Every day | ORAL | Status: DC

## 2014-09-12 MED ORDER — QUETIAPINE FUMARATE 25 MG PO TABS: 12.5000 mg | ORAL_TABLET | Freq: Three times a day (TID) | ORAL | Status: DC | PRN

## 2014-09-12 MED ORDER — BUPROPION HCL ER (XL) 300 MG PO TB24: 300.0000 mg | ORAL_TABLET | Freq: Every day | ORAL | Status: DC

## 2014-09-12 MED ORDER — GABAPENTIN 100 MG PO CAPS: 200.0000 mg | ORAL_CAPSULE | Freq: Three times a day (TID) | ORAL | Status: DC

## 2014-09-12 MED ORDER — SUCRALFATE 1 G PO TABS: 1.0000 g | ORAL_TABLET | Freq: Three times a day (TID) | ORAL | Status: DC

## 2014-09-12 NOTE — [facility] Group Notes (Signed)
   [facility]  09/12/2014  8:45 AM   Participation Quality: Alert, Appropriate and Oriented  Mood/Affect: Appropriate  Depression Rating: 1  Anxiety Rating: 3  Thoughts of Suicide: Pt denies SI/HI  Will you contract for safety? Yes  Current AVH: Pt denies  Plan for Discharge/Comments: Pt attended discharge planning group and actively participated in group. CSW provided pt with today's workbook. Patient shared that she feels "good" today. She reports feeling ready to discharge and will return home with outpatient services.   Transportation Means: Pt reports access to transportation  Supports: No supports mentioned at this time  Samuella BruinKristin Erica Richwine, MSW, [employer] Inc[employer]CSWA Clinical Social Worker [facility] [phone]

## 2014-09-12 NOTE — Discharge Summary (Signed)
Physician Discharge Summary Note  Patient:  Emily Hansen is an 19 y.o., female MRN:  [id number] DOB:  [birth year] Patient phone:  [phone] (home)  Patient address:   [address] [place] [address],  Total Time spent with patient: Greater than 30 minutes  Date of Admission:  09/05/2014 Date of Discharge: 09/12/2014  Reason for Admission:  Per H&P admission:  Emily Hansen is an 19 y.o. female that presents to [facility] with her friend, Florentina Addison, and her grandmother. Pt came in after going to [facility], seeing the physician there, and she felt he did not "listen to what is going on with me. He just prescribed me medicine." Pt stated he prescribed Lamictal and Paxil. Pt stated she then called the mental health association and was referred to [facility]. Pt reports SI, stating she wants to die and doesn't care if a car its her. Pt denies any previous suicide attempt. Pt denies self-harm, but does have a hx of cutting in high school. Pt denies HI. Pt denies AVH, no delusions noted. Pt reports sx including not eating and losing 12 lbs recently, not sleeping, laying in bed all day, not grooming or taking care of her hygiene, labile mood, crying spells, anger, irritability. 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 She stated she has not had one in months. Pt reports obsessive thoughts of things in the past with family members, and engages in ritualistic behaviors such as tapping if a tree goes by while driving, picking the second item on the shelf at the grocery store, etc. Pt reports she drinks alcohol occasionally, but has been smoking marijuana daily since she moved to [place] in January. Pt stated she does this "be cause I feel numb" and because she lost her friend and had a recent breakup.  Her friend she lost is also her roommate. Pt has had therapy in high school, has seen a PCP once and was prescribed Prozac, but she was allergic, so went to a psychiatrist, Dr. Imogene Burn, and then to [place] once. Pt has had no other treatment. Pt cooperative, pleasant, oriented x 4, had rapid, pressured speech, good eye contact, logical/coherent thought processes, labile mood and affect. Pt stated her father is diagnosed with Bipolar Disorder. Pt stated her mother is not supportive, but her grandmother is as well as her friends.  Principal Problem: Major depressive disorder, recurrent, severe without psychotic features Discharge Diagnoses: Patient Active Problem List   Diagnosis Date Noted  . Bipolar I disorder, most recent episode (or current) manic, moderate [F31.12]   . Major depressive disorder, recurrent, severe without psychotic features [F33.2]   . Cannabis dependence with cannabis-induced anxiety disorder [F12.280]   . Bipolar 1 disorder, manic, moderate [F31.12] 09/05/2014    Musculoskeletal: Strength & Muscle Tone: within normal limits Gait & Station: normal Patient leans: N/A  Psychiatric Specialty Exam:  See Suicide Risk Assessment Physical Exam  Constitutional: She is oriented to person, place, and time.  Neck: Normal range of motion.  Respiratory: Effort normal.  Musculoskeletal: Normal range of motion.  Neurological: She is alert and oriented to person, place, and time.    Review of Systems  Psychiatric/Behavioral: Negative for suicidal ideas and memory loss. Depression: Stable. Substance abuse: THC. Nervous/anxious: Stable. Insomnia: Stable.   All other systems  reviewed and are negative.   Blood pressure 119/74, pulse 119, temperature 98.4 F (36.9 C), temperature source Oral, resp. rate 18, height 5\' 8"  (1.727 m), weight 74.39 kg (164 lb), last menstrual period 09/05/2014.Body mass index is 24.94 kg/(m^2).    Past Medical History:  Past Medical History  Diagnosis  Date  . Bipolar disorder    History reviewed. No pertinent past surgical history. Family History: History reviewed. No pertinent family history. Social History:  History  Alcohol Use  . Yes    Comment: has not drank in 2 weeks, social drinker     History  Drug Use  . Yes  . Special: Marijuana    Comment: daily use    History   Social History  . Marital Status: Single    Spouse Name: N/A  . Number of Children: N/A  . Years of Education: N/A   Social History Main Topics  . Smoking status: Never Smoker   . Smokeless tobacco: Not on file  . Alcohol Use: Yes     Comment: has not drank in 2 weeks, social drinker  . Drug Use: Yes    Special: Marijuana     Comment: daily use  . Sexual Activity: Not on file   Other Topics Concern  . None   Social History Narrative   Risk to Self: Suicidal Ideation: Yes-Currently Present (pt stated, "I want to die.  If a car hit me, I wouldn't care) Suicidal Intent: No Is patient at risk for suicide?: Yes Suicidal Plan?: No Access to Means: No What has been your use of drugs/alcohol within the last 12 months?: Denies  How many times?: 0 Other Self Harm Risks: na - pt denies Triggers for Past Attempts: None known Intentional Self Injurious Behavior: None (hx cutting in high school) Risk to Others: Homicidal Ideation: No Thoughts of Harm to Others: No Current Homicidal Intent: No Current Homicidal Plan: No Access to Homicidal Means: No Identified Victim: na - pt denies History of harm to others?: No Assessment of Violence: None Noted Violent Behavior Description: na - pt cooperative Does patient have access to weapons?: No Criminal Charges Pending?: No Does patient have a court date: No Prior Inpatient Therapy: Prior Inpatient Therapy: No Prior Therapy Dates: na Prior Therapy Facilty/Provider(s): na Reason for Treatment: na Prior Outpatient Therapy: Prior Outpatient Therapy: Yes Prior Therapy Dates: Dr. Imogene Burnhen (psychiatrist - 1x),  [facility] therapist in high school Prior Therapy Facilty/Provider(s): Dr. Odie Serahen, [facility], high school counselor Reason for Treatment: med mgnt/therapy  Level of Care:  OP  Hospital Course:  Emily Hansen was admitted for Major depressive disorder, recurrent, severe without psychotic features and crisis management.  She was treated discharged with the medications listed below under Medication List.  Medical problems were identified and treated as needed.  Home medications were restarted as appropriate.  Improvement was monitored by observation and Emily Hansen daily report of symptom reduction.  Emotional and mental status was monitored by daily self-inventory reports completed by Emily Hansen and clinical staff.         Emily Hansen was evaluated by the treatment team for stability and plans for continued recovery upon discharge.  Emily Hansen motivation was an integral factor for scheduling further treatment.  Employment, transportation, bed availability, health status, family support, and any pending legal issues were also considered during her hospital stay.  She was offered further treatment options upon discharge including but not limited to Residential, Intensive  Outpatient, and Outpatient treatment.  Emily Sanes will follow up with the services as listed below under Follow Up Information.     Upon completion of this admission the patient was both mentally and medically stable for discharge denying suicidal/homicidal ideation, auditory/visual/tactile hallucinations, delusional thoughts and paranoia.      Consults:  psychiatry  Significant Diagnostic Studies:  labs: CBC/Diff, CMET, UDS, ETOH  Discharge Vitals:   Blood pressure 119/74, pulse 119, temperature 98.4 F (36.9 C), temperature source Oral, resp. rate 18, height  (1.727 m), weight 74.39 kg (164 lb), last menstrual period 09/05/2014. Body mass index is 24.94 kg/(m^2). Lab Results:   No results found for this  or any previous visit (from the past 72 hour(s)).  Physical Findings: AIMS: Facial and Oral Movements Muscles of Facial Expression: None, normal Lips and Perioral Area: None, normal Jaw: None, normal Tongue: None, normal,Extremity Movements Upper (arms, wrists, hands, fingers): None, normal Lower (legs, knees, ankles, toes): None, normal, Trunk Movements Neck, shoulders, hips: None, normal, Overall Severity Severity of abnormal movements (highest score from questions above): None, normal Incapacitation due to abnormal movements: None, normal Patient's awareness of abnormal movements (rate only patient's report): No Awareness, Dental Status Current problems with teeth and/or dentures?: No Does patient usually wear dentures?: No  CIWA:    COWS:      See Psychiatric Specialty Exam and Suicide Risk Assessment completed by Attending Physician prior to discharge.  Discharge destination:  Home  Is patient on multiple antipsychotic therapies at discharge:  No   Has Patient had three or more failed trials of antipsychotic monotherapy by history:  No    Recommended Plan for Multiple Antipsychotic Therapies: NA      Discharge Instructions    Activity as tolerated - No restrictions    Complete by:  As directed      Diet general    Complete by:  As directed      Discharge instructions    Complete by:  As directed   Take all of you medications as prescribed by your mental healthcare provider.  Report any adverse effects and reactions from your medications to your outpatient provider promptly. Do not engage in alcohol and or illegal drug use while on prescription medicines. In the event of worsening symptoms call the crisis hotline, 911, and or go to the nearest emergency department for appropriate evaluation and treatment of symptoms. Follow-up with your primary care provider for your medical issues, concerns and or health care needs.   Keep all scheduled appointments.  If you are  unable to keep an appointment call to reschedule.  Let the nurse know if you will need medications before next scheduled appointment.            Medication List    STOP taking these medications        AMBIEN PO      TAKE these medications      Indication   buPROPion 300 MG 24 hr tablet  Commonly known as:  WELLBUTRIN XL  Take 1 tablet (300 mg total) by mouth daily. For depression   Indication:  Major Depressive Disorder     drospirenone-ethinyl estradiol 3-0.02 MG tablet  Commonly known as:  YAZ,GIANVI,LORYNA  Take 1 tablet by mouth daily.      gabapentin 100 MG capsule  Commonly known as:  NEURONTIN  Take 2 capsules (200 mg total) by mouth 3 (three) times daily. For agitation   Indication:  Agitation  pantoprazole 40 MG tablet  Commonly known as:  PROTONIX  Take 1 tablet (40 mg total) by mouth daily. For GERD      QUEtiapine 100 MG tablet  Commonly known as:  SEROQUEL  Take 1 tablet (100 mg total) by mouth at bedtime. For mood control/sleep   Indication:  mood control/sleep     QUEtiapine 25 MG tablet  Commonly known as:  SEROQUEL  Take 0.5 tablets (12.5 mg total) by mouth every 8 (eight) hours as needed (Anxiety).   Indication:  anxiety     sucralfate 1 G tablet  Commonly known as:  CARAFATE  Take 1 tablet (1 g total) by mouth 4 (four) times daily -  with meals and at bedtime. For GERD   Indication:  Gastroesophageal Reflux Disease       Follow-up Information    Follow up with Janit Bern, [facility] On 09/18/2014.   Why:  You are scheduled with Janit Bern on Tuesday, Sep 18, 2014 at [facility].  Please bring completed new patient paperwork packet and insurance card with you to appointment.  Please call if you need to reschedule.   Contact information:   [address] [place], [place] [address] phone:  [phone] Fax:  [phone]      Follow up with Horald Pollen - [facility] On 09/19/2014.   Why:  You are scheduled with  Maralyn Sago Robinsion for medication management on Wednesday, Sep 20, 2014 at 11 AM   Contact information:   [address] [place], [place] [address] phone:  [phone] Fax:  [phone]      Follow-up recommendations:  Activity:  As tolerated Diet:  As tolerated  Comments:   Patient has been instructed to take medications as prescribed; and report adverse effects to outpatient provider.  Follow up with primary doctor for any medical issues and If symptoms recur report to nearest emergency or crisis hot line.    Total Discharge Time: Greater than 30 minutes  Signed: Assunta Found, FNP-BC  09/12/2014, 4:04 PM   Patient seen, Suicide Assessment Completed.  Disposition Plan Reviewed

## 2014-09-12 NOTE — Progress Notes (Signed)
  [facility] Dis[facility] :  Will you be returning to the same living situation after discharge:  Yes,  patient plans to return to her residence At discharge, do you have transportation home?: Yes,  patient reports access to transportation Do you have the ability to pay for your medications: Yes,  patient will be provided with medication samples and prescriptions at discharge  Release of information consent forms completed and in the chart;  Patient's signature needed at discharge.  Patient to Follow up at: Follow-up Information    Follow up with Janit BernKate Theall, [facility] - [facility] On 09/18/2014.   Why:  You are scheduled with Janit BernKate Theall on Tuesday, Sep 18, 2014 at [facility].  Please bring completed new patient paperwork packet and insurance card with you to appointment.  Please call if you need to reschedule.   Contact information:   [address] [place], [place] [address] phone:  [phone] Fax:  [phone]      Follow up with Horald PollenSarah Robinson - [facility] On 09/19/2014.   Why:  You are scheduled with Maralyn SagoSarah Robinsion for medication management on Wednesday, Sep 20, 2014 at 11 AM   Contact information:   [address] [place], [place] [address] phone:  [phone] Fax:  [phone]      Patient denies SI/HI: Yes, denies  Safety Planning and Suicide Prevention discussed: Yes,  with patient  Have you used any form of tobacco in the last 30 days? (Cigarettes, Smokeless Tobacco, Cigars, and/or Pipes): No  Has patient been referred to the Quitline?: N/A patient is not a smoker  Demetrick Eichenberger L 09/12/2014, 10:00 AM

## 2014-09-12 NOTE — [facility] Suicide Risk Assessment (Signed)
[facility] Discharge Suicide Risk Assessment   Demographic Factors:  19 year old single female  Total Time spent with patient: 30 minutes  Musculoskeletal: Strength & Muscle Tone: within normal limits Gait & Station: normal Patient leans: N/A  Psychiatric Specialty Exam: Physical Exam  ROS  Blood pressure 119/74, pulse 119, temperature 98.4 F (36.9 C), temperature source Oral, resp. rate 18, height  (1.727 m), weight 164 lb (74.39 kg), last menstrual period 09/05/2014.Body mass index is 24.94 kg/(m^2).  General Appearance: improved grooming  Eye Contact::  Good  Speech:  Normal Rate409  Volume:  Normal  Mood:  improved and at this time denies depression  Affect:  Appropriate and Full Range  Thought Process:  Goal Directed and Linear  Orientation:  Full (Time, Place, and Person)  Thought Content:  denies hallucinations, no delusions  Suicidal Thoughts:  No  Homicidal Thoughts:  No  Memory:  recent and remote grossly intact   Judgement:  Other:  improved  Insight:  Present  Psychomotor Activity:  Normal  Concentration:  Good  Recall:  Good  Fund of Knowledge:Good  Language: Good  Akathisia:  Negative  Handed:  Right  AIMS (if indicated):     Assets:  Communication Skills Desire for Improvement Housing Physical Health Social Support  Sleep:  Number of Hours: 6.25  Cognition: WNL  ADL's:  Improved    Have you used any form of tobacco in the last 30 days? (Cigarettes, Smokeless Tobacco, Cigars, and/or Pipes): No  Has this patient used any form of tobacco in the last 30 days? (Cigarettes, Smokeless Tobacco, Cigars, and/or Pipes) No  Mental Status Per Nursing Assessment::   On Admission:     Current Mental Status by Physician: At this time patient improved compared to admission- states she feels better and at present denies depression, speech is normal, no psychomotor restlessness or agitation, mood seems euthymic, affect is appropriate, reactive, no thought disorder,  no SI or HI, no psychotic symptoms.  Loss Factors:  recent break up and recent distancing from a good friend  Historical Factors: History of Depression and of cutting in the past   Risk Reduction Factors:   Sense of responsibility to family, Positive social support and Positive coping skills or problem solving skills  Continued Clinical Symptoms:  As noted, at this time reports improved mood and currently presents euthymic. She denies any SI and no psychotic symptoms. Behavior on unit is calm and in good control. She reports significant improvement of nausea she had been reporting upon admission, and reflux type symptoms have subsided. She states she is sleeping better. At this time does not endorse medication side effects. We have reviewed medication side effect profile, to include risks associated with Seroquel, such as movement disorders, metabolic issues, weight gain.  Cognitive Features That Contribute To Risk:  No gross cognitive deficits noted upon discharge. Is alert , attentive, and oriented x 3   Suicide Risk:  Mild:  Suicidal ideation of limited frequency, intensity, duration, and specificity.  There are no identifiable plans, no associated intent, mild dysphoria and related symptoms, good self-control (both objective and subjective assessment), few other risk factors, and identifiable protective factors, including available and accessible social support.  Principal Problem: Major depressive disorder, recurrent, severe without psychotic features Discharge Diagnoses:  Patient Active Problem List   Diagnosis Date Noted  . Major depressive disorder, recurrent, severe without psychotic features [F33.2]   . Cannabis dependence with cannabis-induced anxiety disorder [F12.280]   . Bipolar 1 disorder,  manic, moderate [F31.12] 09/05/2014    Follow-up Information    Follow up with Janit BernKate Theall, [facility] On 09/18/2014.   Why:  You are scheduled with Janit BernKate Theall on  Tuesday, Sep 18, 2014 at [facility].  Please bring completed new patient paperwork packet and insurance card with you to appointment.  Please call if you need to reschedule.   Contact information:   [address] [place], [place] [address] phone:  [phone] Fax:  [phone]      Follow up with H[facility]d Poll[facility] On 09/19/2014.   Why:  You are scheduled with Maralyn SagoSarah Robinsion for medication management on Wednesday, Sep 20, 2014 at 11 AM   Contact information:   [address] [place], [place] [address] phone:  [phone] Fax:  [phone]      Plan Of Care/Follow-up recommendations:  Activity:  as tolerated Diet:  Regular Tests:  NA Other:  See below  Is patient on multiple antipsychotic therapies at discharge:  No   Has Patient had three or more failed trials of antipsychotic monotherapy by history:  No  Recommended Plan for Multiple Antipsychotic Therapies: NA   Patient is leaving unit in good spirits. Plans to follow up as above. States she has established PCP to follow up regarding nausea, reflux, PPI management.      Tytus Strahle 09/12/2014, 12:18 PM

## 2014-09-12 NOTE — Progress Notes (Signed)
Discharge Note: Discharge instructions/prescriptions/medication samples/letter and information packet for the follow-up appointment given to patient. Patient verbalized understanding of discharge instructions and prescriptions. Returned belongings to patient. Denies SI/HI/AVH. Patient d/c without incident to the lobby and transported home from the hospital with her grandmother.

## 2014-09-13 ENCOUNTER — Telehealth (HOSPITAL_COMMUNITY): Payer: Self-pay | Admitting: Licensed Clinical Social Worker

## 2014-12-17 ENCOUNTER — Other Ambulatory Visit: Payer: Self-pay | Admitting: Student

## 2014-12-17 DIAGNOSIS — R634 Abnormal weight loss: Secondary | ICD-10-CM

## 2014-12-21 ENCOUNTER — Ambulatory Visit
Admission: RE | Admit: 2014-12-21 | Discharge: 2014-12-21 | Disposition: A | Discharge status: Home / Self Care | Payer: BLUE CROSS/BLUE SHIELD | Source: Ambulatory Visit | Attending: Student | Admitting: Student

## 2014-12-21 DIAGNOSIS — R634 Abnormal weight loss: Secondary | ICD-10-CM | POA: Insufficient documentation

## 2014-12-21 DIAGNOSIS — K828 Other specified diseases of gallbladder: Secondary | ICD-10-CM | POA: Insufficient documentation

## 2014-12-25 ENCOUNTER — Other Ambulatory Visit: Payer: Self-pay | Admitting: Student

## 2014-12-25 DIAGNOSIS — R112 Nausea with vomiting, unspecified: Secondary | ICD-10-CM

## 2014-12-25 DIAGNOSIS — R634 Abnormal weight loss: Secondary | ICD-10-CM

## 2014-12-27 ENCOUNTER — Ambulatory Visit: Payer: BLUE CROSS/BLUE SHIELD

## 2014-12-28 ENCOUNTER — Ambulatory Visit
Admission: RE | Admit: 2014-12-28 | Discharge: 2014-12-28 | Disposition: A | Discharge status: Home / Self Care | Payer: BLUE CROSS/BLUE SHIELD | Source: Ambulatory Visit | Attending: Student | Admitting: Student

## 2014-12-28 DIAGNOSIS — R634 Abnormal weight loss: Secondary | ICD-10-CM | POA: Diagnosis not present

## 2014-12-28 DIAGNOSIS — R112 Nausea with vomiting, unspecified: Secondary | ICD-10-CM | POA: Diagnosis present

## 2014-12-28 MED ORDER — IOHEXOL 300 MG/ML  SOLN
85.0000 mL | Freq: Once | INTRAMUSCULAR | Status: AC | PRN
  Administered 2014-12-28: 85 mL via INTRAVENOUS

## 2015-05-13 ENCOUNTER — Emergency Department
Admission: EM | Admit: 2015-05-13 | Discharge: 2015-05-13 | Disposition: A | Discharge status: Home / Self Care | Payer: BLUE CROSS/BLUE SHIELD | Attending: Emergency Medicine | Admitting: Emergency Medicine

## 2015-05-13 ENCOUNTER — Encounter: Payer: Self-pay | Admitting: Emergency Medicine

## 2015-05-13 DIAGNOSIS — R002 Palpitations: Secondary | ICD-10-CM

## 2015-05-13 DIAGNOSIS — Z3202 Encounter for pregnancy test, result negative: Secondary | ICD-10-CM | POA: Diagnosis not present

## 2015-05-13 DIAGNOSIS — R109 Unspecified abdominal pain: Secondary | ICD-10-CM | POA: Diagnosis not present

## 2015-05-13 DIAGNOSIS — Z79899 Other long term (current) drug therapy: Secondary | ICD-10-CM | POA: Insufficient documentation

## 2015-05-13 DIAGNOSIS — R630 Anorexia: Secondary | ICD-10-CM | POA: Diagnosis not present

## 2015-05-13 DIAGNOSIS — R000 Tachycardia, unspecified: Secondary | ICD-10-CM | POA: Insufficient documentation

## 2015-05-13 DIAGNOSIS — G8929 Other chronic pain: Secondary | ICD-10-CM | POA: Diagnosis not present

## 2015-05-13 HISTORY — DX: Supraventricular tachycardia: I47.1

## 2015-05-13 HISTORY — DX: Supraventricular tachycardia, unspecified: I47.10

## 2015-05-13 LAB — URINALYSIS COMPLETE WITH MICROSCOPIC (ARMC ONLY)
Bacteria, UA: NONE SEEN
GLUCOSE, UA: NEGATIVE mg/dL
Hgb urine dipstick: NEGATIVE
Leukocytes, UA: NEGATIVE
NITRITE: NEGATIVE
Protein, ur: 100 mg/dL — AB
RBC / HPF: NONE SEEN RBC/hpf (ref 0–5)
SPECIFIC GRAVITY, URINE: 1.026 (ref 1.005–1.030)
pH: 6 (ref 5.0–8.0)

## 2015-05-13 LAB — BASIC METABOLIC PANEL
Anion gap: 9 (ref 5–15)
BUN: 7 mg/dL (ref 6–20)
CO2: 27 mmol/L (ref 22–32)
Calcium: 9.5 mg/dL (ref 8.9–10.3)
Chloride: 101 mmol/L (ref 101–111)
Creatinine, Ser: 0.62 mg/dL (ref 0.44–1.00)
GFR calc Af Amer: >60 mL/min (ref >60)
GLUCOSE: 95 mg/dL (ref 65–99)
Potassium: 3.6 mmol/L (ref 3.5–5.1)
Sodium: 137 mmol/L (ref 135–145)

## 2015-05-13 LAB — CBC
HCT: 39.1 % (ref 35.0–47.0)
HEMOGLOBIN: 13.2 g/dL (ref 12.0–16.0)
MCH: 30.3 pg (ref 26.0–34.0)
MCHC: 33.7 g/dL (ref 32.0–36.0)
MCV: 90 fL (ref 80.0–100.0)
PLATELETS: 208 10*3/uL (ref 150–440)
RBC: 4.34 MIL/uL (ref 3.80–5.20)
RDW: 14.2 % (ref 11.5–14.5)
WBC: 8.5 10*3/uL (ref 3.6–11.0)

## 2015-05-13 LAB — POCT PREGNANCY, URINE: Preg Test, Ur: NEGATIVE

## 2015-05-13 NOTE — ED Notes (Signed)
Reports HR around 120 all day but got up to 180 (has hx svt).  Chest has been tight today but has also been tight every day since April per pt. Patient used to weight 180lb in April but has had decreased appetite since then and vomiting or diarrhea when eating.

## 2015-05-13 NOTE — Discharge Instructions (Signed)
Your heart rate has slowed down and your EKG looks good. Blood tests looked good. [facility]. Return to the emergency department if you have further palpitations/tachycardia, worsening pain, or other urgent concerns.  Nonspecific Tachycardia Tachycardia is a faster than normal heartbeat (more than 100 beats per minute). In adults, the heart normally beats between 60 and 100 times a minute. A fast heartbeat may be a normal response to exercise or stress. It does not necessarily mean that something is wrong. However, sometimes when your heart beats too fast it may not be able to pump enough blood to the rest of your body. This can result in chest pain, shortness of breath, dizziness, and even fainting. Nonspecific tachycardia means that the specific cause or pattern of your tachycardia is unknown. CAUSES  Tachycardia may be harmless or it may be due to a more serious underlying cause. Possible causes of tachycardia include:  Exercise or exertion.  Fever.  Pain or injury.  Infection.  Loss of body fluids (dehydration).  Overactive thyroid.  Lack of red blood cells (anemia).  Anxiety and stress.  Alcohol.  Caffeine.  Tobacco products.  Diet pills.  Illegal drugs.  Heart disease. SYMPTOMS  Rapid or irregular heartbeat (palpitations).  Suddenly feeling your heart beating (cardiac awareness).  Dizziness.  Tiredness (fatigue).  Shortness of breath.  Chest pain.  Nausea.  Fainting. DIAGNOSIS  Your caregiver will perform a physical exam and take your medical history. In some cases, a heart specialist (cardiologist) may be consulted. Your caregiver may also order:  Blood tests.  Electrocardiography. This test records the electrical activity of your heart.  A heart monitoring test. TREATMENT  Treatment will depend on the likely cause of your tachycardia. The goal is to treat the underlying cause of your tachycardia. Treatment  methods may include:  Replacement of fluids or blood through an intravenous (IV) tube for moderate to severe dehydration or anemia.  New medicines or changes in your current medicines.  Diet and lifestyle changes.  Treatment for certain infections.  Stress relief or relaxation methods. HOME CARE INSTRUCTIONS   Rest.  Drink enough fluids to keep your urine clear or pale yellow.  Do not smoke.  Avoid:  Caffeine.  Tobacco.  Alcohol.  Chocolate.  Stimulants such as over-the-counter diet pills or pills that help you stay awake.  Situations that cause anxiety or stress.  Illegal drugs such as marijuana, phencyclidine (PCP), and cocaine.  Only take medicine as directed by your caregiver.  Keep all follow-up appointments as directed by your caregiver. SEEK IMMEDIATE MEDICAL CARE IF:   You have pain in your chest, upper arms, jaw, or neck.  You become weak, dizzy, or feel faint.  You have palpitations that will not go away.  You vomit, have diarrhea, or pass blood in your stool.  Your skin is cool, pale, and wet.  You have a fever that will not go away with rest, fluids, and medicine. MAKE SURE YOU:   Understand these instructions.  Will watch your condition.  Will get help right away if you are not doing well or get worse.   This information is not intended to replace advice given to you by your health care provider. Make sure you discuss any questions you have with your health care provider.   Document Released: 06/11/2004 Document Revised: 07/27/2011 Document Reviewed: 11/16/2014 Elsevier Interactive Patient Education [employer]2016 [employer]

## 2015-05-13 NOTE — ED Provider Notes (Signed)
[facility] Emergency Department Provider Note  ____________________________________________  Time seen: 42  I have reviewed the triage vital signs and the nursing notes.  History by:  Primarily from patient area patient's mother present also.  HISTORY  Chief Complaint Tachycardia     HPI Emily Hansen is a 19 y.o. female who has a history of episodes of tachycardia. She feels like her heart has been racing much of the day, though at this time, the rate appears normal. She reports her rate has primarily been 120, but she has seen readings off of the tap on her iPhone of 170 and 180.  The patient has problems like this before. She has been seen by Dr. Darrold Junker, cardiology. She reports having some tightness in her chest with this. She feels anxious. She feels anxiety than makes the tachycardia worse, which is likely true.  The patient reports that she has been having problems with pain, primarily in her abdomen, since April. She has been seen by her primary physician, Dr. Thedore Mins at [facility] clinic, as well as by gastroenterology. The pain is unexplained. She has lost her appetite and she has lost a significant amount of weight.  Towards the end of our conversation, the patient understandably becomes tearful.   Past Medical History  Diagnosis Date  . Bipolar disorder (HCC)   . SVT (supraventricular tachycardia) [facility])     Patient Active Problem List   Diagnosis Date Noted  . Bipolar I disorder, most recent episode (or current) manic, moderate (HCC)   . Major depressive disorder, recurrent, severe without psychotic features (HCC)   . 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 Bipolar 1 disorder, manic, moderate (HCC) 09/05/2014    History reviewed. No pertinent past surgical history.  Current Outpatient Rx  Name  Route  Sig  Dispense  Refill  . buPROPion (WELLBUTRIN XL) 300 MG 24 hr tablet   Oral   Take 1 tablet (300 mg total)  by mouth daily. For depression   30 tablet   0   . drospirenone-ethinyl estradiol (YAZ,GIANVI,LORYNA) 3-0.02 MG tablet   Oral   Take 1 tablet by mouth daily.         Marland Kitchen gabapentin (NEURONTIN) 100 MG capsule   Oral   Take 2 capsules (200 mg total) by mouth 3 (three) times daily. For agitation   180 capsule   0   . pantoprazole (PROTONIX) 40 MG tablet   Oral   Take 1 tablet (40 mg total) by mouth daily. For GERD   30 tablet   0   . QUEtiapine (SEROQUEL) 100 MG tablet   Oral   Take 1 tablet (100 mg total) by mouth at bedtime. For mood control/sleep   30 tablet   0   . QUEtiapine (SEROQUEL) 25 MG tablet   Oral   Take 0.5 tablets (12.5 mg total) by mouth every 8 (eight) hours as needed (Anxiety).   15 tablet   0   . sucralfate (CARAFATE) 1 G tablet   Oral   Take 1 tablet (1 g total) by mouth 4 (four) times daily -  with meals and at bedtime. For GERD   120 tablet   0     Allergies Prozac  History reviewed. No pertinent family history.  Social History Social History  Substance Use Topics  . Smoking status: Never Smoker   . Smokeless tobacco: None  . Alcohol Use: Yes     Comment: has not drank in  2 weeks, social drinker    Review of Systems  Constitutional: Negative for fever/chills. ENT: Negative for congestion. Cardiovascular: Palpitations with tachycardia. See history of present illness. Respiratory: Negative for cough. Gastrointestinal: Chronic abdominal pain with loss of appetite. See history of present illness. Genitourinary: Negative for dysuria. Musculoskeletal: No back pain. Skin: Negative for rash. Neurological: Negative for headache or focal weakness   10-point ROS otherwise negative.  ____________________________________________   PHYSICAL EXAM:  VITAL SIGNS: ED Triage Vitals  Enc Vitals Group     BP 05/13/15 1728 134/80 mmHg     Pulse Rate 05/13/15 1728 80     Resp 05/13/15 1728 16     Temp 05/13/15 1728 97.9 F (36.6 C)      Temp Source 05/13/15 1728 Oral     SpO2 05/13/15 1728 98 %     Weight 05/13/15 1728 111 lb (50.349 kg)     Height 05/13/15 1728 5\' 7"  (1.702 m)     Head Cir --      Peak Flow --      Pain Score 05/13/15 1730 5     Pain Loc --      Pain Edu? --      Excl. in GC? --     Constitutional: Alert and oriented. No distress. ENT   Head: Normocephalic and atraumatic.   Nose: No congestion/rhinnorhea.       Mouth: No erythema, no swelling   Cardiovascular: Normal rate at 86, regular rhythm, no murmur noted Respiratory:  Normal respiratory effort, no tachypnea.    Breath sounds are clear and equal bilaterally.  Gastrointestinal: Soft, no distention. Nontender Back: No muscle spasm, no tenderness, no CVA tenderness. Musculoskeletal: No deformity noted. Nontender with normal range of motion in all extremities.  No noted edema. Neurologic:  Communicative. Normal appearing spontaneous movement in all 4 extremities. No gross focal neurologic deficits are appreciated.  Skin:  Skin is warm, dry. No rash noted. Psychiatric: Mood and affect are normal. Speech and behavior are normal.  ____________________________________________    LABS (pertinent positives/negatives)  Labs Reviewed  URINALYSIS COMPLETEWITH MICROSCOPIC ([facility] ONLY) - Abnormal; Notable for the following:    Color, Urine AMBER (*)    APPearance CLEAR (*)    Bilirubin Urine 1+ (*)    Ketones, ur TRACE (*)    Protein, ur 100 (*)    Squamous Epithelial / LPF 0-5 (*)    All other components within normal limits  BASIC METABOLIC PANEL  CBC  POC URINE PREG, ED  POCT PREGNANCY, URINE     ____________________________________________   EKG  ED ECG REPORT I, Usbaldo Pannone W, the attending physician, personally viewed and interpreted this ECG.   Date: 05/13/2015  EKG Time: 1722  Rate: 91  Rhythm:  Normal sinus rhythm  Axis: Normal  Intervals: QTC of 482  ST&T Change: None  noted   ____________________________________________   ____________________________________________   INITIAL IMPRESSION / ASSESSMENT AND PLAN / ED COURSE  Pertinent labs & imaging results that were available during my care of the patient were reviewed by me and considered in my medical decision making (see chart for details).  Thin area she's had episodes like this in the past. She has what sounds like a complicated pain syndrome that has not been fully diagnosed. She has diminished appetite and has lost weight. I've discussed this at length with her and her mother. Given no other organic explanation, I think that with rheumatology may be appropriate. She may need to be  evaluated for fibromyalgia or an inflammatory process.  I have ordered a urinalysis and pregnancy test to ensure that she does not have any additional physiologic stress that could be causing tachycardia or cause for her described abdominal pain which is nontender on exam.   ----------------------------------------- 8:20 PM on 05/13/2015 -----------------------------------------  Urinalysis and pregnancy test were negative. We'll discharge the patient for follow-up at [facility] clinic.  ____________________________________________   FINAL CLINICAL IMPRESSION(S) / ED DIAGNOSES  Final diagnoses:  Heart palpitations  Tachycardia      Darien Ramus, MD 05/13/15 2021

## 2015-05-13 NOTE — ED Notes (Signed)
Has been worked up by GI for weight loss. Sees cardiology r/t SVT.  Has had thyroid evaluated X 5 per pt with normal results.

## 2015-08-23 ENCOUNTER — Encounter: Payer: BLUE CROSS/BLUE SHIELD | Attending: Internal Medicine | Admitting: Dietician

## 2015-08-23 ENCOUNTER — Encounter: Payer: Self-pay | Admitting: Dietician

## 2015-08-23 VITALS — Ht 65.0 in | Wt 111.6 lb

## 2015-08-23 DIAGNOSIS — F5000 Anorexia nervosa, unspecified: Secondary | ICD-10-CM

## 2015-08-23 NOTE — Progress Notes (Signed)
Medical Nutrition Therapy: Visit start time: 9:20  end time: 10:40 Assessment:  Diagnosis: anorexia Past medical history: anxiety, bipolar disorder, depression Psychosocial issues/ stress concerns: Patient rates her stress as "high" and indicates "poorly" as to how well she is dealing with her stress. Her PHQ depression score was 21 but patient answered "Not at all" as to how often in past 2 weeks she has thought of hurting herself or others.  She states she has not received counseling in past month because her counselor [facility] in [place] left. She is open to having [facility] visit with an [facility] counselor in order to be given direction on further counseling options. Preferred learning method:  . Auditory . Hands-on  Current weight: 111.6 lbs  Height: 65 in Medications, supplements: see list Progress and evaluation:  Patient in for initial medical nutrition therapy appointment. She became teary when I indicated that that the referral from Dr. Thedore MinsSingh indicated anorexia. She stated,"Jasmine is the only one who thinks I have an eating disorder. When asked, she stated that the [facility] counselor said that she had anxiety and that is the reason she "can't eat". She gives a weight history of highest weight of 180 lbs, gaining from 140 to 180 when using birth control patch. She reports a weight of 160 lbs 08/2014 when [facility]. She continued to lose weight and reports a weight of 116 lbs in the fall of last year and she states she has been able to maintain that weight. I weighed her at the end of today's visit and she weighed 111.6 lbs. She gives a weight goal of 130 lbs and then stated, "But I want any gain to be muscle and not fat". As I talked about ways to add calories, she became teary again and I asked if there was fear of gaining weight and she stated "yes". She reports she purges by vomiting 1x/day. She denies use of laxatives.Her  diet is low in protein and calcium sources and almost void of fruits/vegetables despite being able to name a variety of each that she likes and can eat. All of her meals are eaten out and she states are all "fast food". Her diet is also inadequate in fluids and she stated, "I have trouble making myself drink anything, even water". Physical activity: none  Dietary Intake:  Usual eating pattern includes 3 meals and 0-1 snacks per day. Dining out frequency: 10 meals per week but limited amounts at meals  Breakfast: High protein Ensure but stated she just drank half of it this am because she saw that the expiration date was 04/2015. Snack:water Is in class from 9:00-1:00 Monday-Thursday and doesn't eat despite a 10 minute break every hour. Supper: 5:00pm- 1 bite of hamburger, small fries (ate all of these) Snack: 10:00pm- 2 beef tacos - She states that she considers this a full meal and considers it binging. Beverages: 16 oz water daily, 16-20 oz. tea  Nutrition Care Education: Anorexia: Explained that I can not diagnose anorexia but that she certainly has many of the risk factors with her weight loss, fear of gaining weight, aversion to a wide variety of foods and even beverages and an inability to make her self eat. Stressed importance of resuming counseling to work with the underlying issues. Basic nutrition: Explained to patient that I would work with her on steps to add foods that contribute nutrients back into her diet and that becoming more  nourished is a priority but help with the underlying issues is also a priority because she may not be able to make herself eat more. Focused today on ways to increase protein in her diet and to eat more often. Discussed simple snacks and even meals that require little preparation and may not seem as "heavy" as some of the foods offered "out". Nutritional Diagnosis:  NI-5.2 Malnutrition As related to inadequate calories to maintain or gain weight, inadequate  protein, low intake of all food groups .  As evidenced by weight loss, diet history.  Intervention:  Drink 1 container ensure with added protein each morning. 10:00 snack- fruit/cheese 1-1:15- Take enough food to have a second snack: apple with peanut butter 4:30 high protein ensure or an easy to prepare meal:  [place] sandwich, fruit or baked potato with cheese or egg/cheese on biscuit, or chicken nuggets, fruit or vegetable or both. 8:00 meal especially on non-work days. Balance meals with protein (meat, nuts, peanut butter, cheese, eggs,) or protein in Ensure, starch ( bread, crackers, potato, corn for examples) and a fruit or vegetable or both.   Education Materials given:  Marland Kitchen Goals that patient felt were realistic for her to help her increase her intake.  Learner/ who was taught:  . Patient  Level of understanding: . Partial understanding; needs review/ practice Learning barriers: . Eating disorder/malnutrition may effect what patient can follow through on regarding goals.   Willingness to learn/ readiness for change: . Hesitance, contemplating change  Monitoring and Evaluation:  Dietary intake,  and body weight      follow up: 08/30/15 at 8;15am

## 2015-08-23 NOTE — Patient Instructions (Signed)
Drink 1 container ensure with added protein each morning. 10:00 snack- fruit/cheese 1-1:15- Take enough food to have a second snack: apple with peanut butter 4:30 high protein ensure or an easy to prepare meal:  [place] sandwich, fruit or baked potato with cheese or egg/cheese on biscuit, or chicken nuggets, fruit or vegetable or both. 8:00 meal especially on non-work days. Balance meals with protein (meat, nuts, peanut butter, cheese, eggs,) or protein in Ensure, starch ( bread, crackers, potato, corn for examples) and a fruit or vegetable or both.

## 2015-08-30 ENCOUNTER — Encounter: Payer: Self-pay | Admitting: Dietician

## 2015-08-30 ENCOUNTER — Encounter (INDEPENDENT_AMBULATORY_CARE_PROVIDER_SITE_OTHER): Payer: BLUE CROSS/BLUE SHIELD | Admitting: Dietician

## 2015-08-30 VITALS — Ht 65.0 in | Wt 114.6 lb

## 2015-08-30 DIAGNOSIS — F5000 Anorexia nervosa, unspecified: Secondary | ICD-10-CM

## 2015-08-30 NOTE — Patient Instructions (Signed)
Continue with previous goals. Work to increase fluid.intake to at least 6 cups per day.

## 2015-08-30 NOTE — Progress Notes (Signed)
Medical Nutrition Therapy Follow-up visit:  Time with patient: 7:55 - 8:40am Visit #: 2 ASSESSMENT:  Diagnosis: anorexia  Current weight:114.6 lbs    Height:65 in Medications: See list Medical History: anxiety, bipolar disorder, depression, continues to smoke cigarettes and marijuana on daily basis. Progress and evaluation: Patient in for medical nutrition follow-up. She states she has made an effort to eat more even when she does not want to do so. She states that she has not purged by vomiting in past week. She reports that most mornings she has picked up an egg/cheese biscuit, fries and large sweet tea from [employer] and eats 1/2 to 3/4 of biscuit, several fries and sips on the tea during the day. She reports she is not hungry at all at 10:00 now that she is eating breakfast but she did eat grapes one morning for a snack. She reports she has been more consistent in eating a small snack at 1:15 such as cheese or grapes.On at least 3 days in the past week she has eaten a snack or drank Ensure at 4:30pm.  On the days she doesn't work she has been eating a meal at 8:00. She states that one night this past week she ate 2 tacos and drank several sips of a Pepsi. Her weight today was 3 lbs greater than 1 week ago which she attributes partly to being more hydrated. She was weighed without her jacket and shoes as she was last week.   NUTRITION CARE EDUCATION: Anorexia: Asked  Ricardo regarding meeting with [facility] counselor and she stated she was not aware the counselor had left message for her to call and that she is willing to meet with a counselor.  [facility] to [facility] counseling department to set up an appointment. Commended her on keeping today's appointment and on her willingness to meet with a counselor. Basic Nutrition: Commended patient on her effort and success in adding foods to her diet on a more consistent basis. Encouraged again to eat some type of protein with each meal and snack.  Stressed again the importance of adequate hydration and commended on her increasing her fluid intake.  INTERVENTION:  Continue with previous goals. Work to increase fluid.intake to at least 6 cups per day.  EDUCATION MATERIALS GIVEN:  . Goals/ instructions  LEARNER/ who was taught:  . Patient  LEVEL OF UNDERSTANDING: . Partial understanding; needs review/ practice WILLINGNESS TO LEARN/READINESS FOR CHANGE: . Has made some positive diet changes  MONITORING AND EVALUATION:  09/20/15 at 1:15pm

## 2015-09-20 ENCOUNTER — Ambulatory Visit: Payer: Self-pay | Admitting: Dietician

## 2015-09-26 ENCOUNTER — Telehealth: Payer: Self-pay | Admitting: Dietician

## 2015-09-26 NOTE — Telephone Encounter (Signed)
Called patient to reschedule her appointment for medical nutrition therapy follow-up. Her mail box was full so I could not leave a message.

## 2015-10-04 ENCOUNTER — Encounter: Payer: Self-pay | Admitting: Dietician

## 2016-09-07 DIAGNOSIS — R079 Chest pain, unspecified: Secondary | ICD-10-CM | POA: Diagnosis present

## 2016-09-07 DIAGNOSIS — K2900 Acute gastritis without bleeding: Secondary | ICD-10-CM | POA: Insufficient documentation

## 2016-09-07 DIAGNOSIS — Z87891 Personal history of nicotine dependence: Secondary | ICD-10-CM | POA: Diagnosis not present

## 2016-09-08 ENCOUNTER — Encounter: Payer: Self-pay | Admitting: Emergency Medicine

## 2016-09-08 ENCOUNTER — Emergency Department
Admission: EM | Admit: 2016-09-08 | Discharge: 2016-09-08 | Disposition: A | Discharge status: Home / Self Care | Payer: BLUE CROSS/BLUE SHIELD | Attending: Emergency Medicine | Admitting: Emergency Medicine

## 2016-09-08 ENCOUNTER — Emergency Department: Payer: BLUE CROSS/BLUE SHIELD

## 2016-09-08 DIAGNOSIS — R079 Chest pain, unspecified: Secondary | ICD-10-CM

## 2016-09-08 DIAGNOSIS — K2900 Acute gastritis without bleeding: Secondary | ICD-10-CM

## 2016-09-08 LAB — COMPREHENSIVE METABOLIC PANEL
ALT: 10 U/L — ABNORMAL LOW (ref 14–54)
ANION GAP: 11 (ref 5–15)
AST: 19 U/L (ref 15–41)
Albumin: 4.2 g/dL (ref 3.5–5.0)
Alkaline Phosphatase: 44 U/L (ref 38–126)
BUN: 6 mg/dL (ref 6–20)
CO2: 27 mmol/L (ref 22–32)
Calcium: 9 mg/dL (ref 8.9–10.3)
Chloride: 103 mmol/L (ref 101–111)
Creatinine, Ser: 0.9 mg/dL (ref 0.44–1.00)
GFR calc Af Amer: >60 mL/min (ref >60)
GFR calc non Af Amer: >60 mL/min (ref >60)
GLUCOSE: 150 mg/dL — AB (ref 65–99)
POTASSIUM: 3.5 mmol/L (ref 3.5–5.1)
SODIUM: 141 mmol/L (ref 135–145)
Total Bilirubin: 0.5 mg/dL (ref 0.3–1.2)
Total Protein: 7 g/dL (ref 6.5–8.1)

## 2016-09-08 LAB — CBC
HCT: 41.4 % (ref 35.0–47.0)
Hemoglobin: 13.9 g/dL (ref 12.0–16.0)
MCH: 32 pg (ref 26.0–34.0)
MCHC: 33.6 g/dL (ref 32.0–36.0)
MCV: 95.3 fL (ref 80.0–100.0)
PLATELETS: 183 10*3/uL (ref 150–440)
RBC: 4.35 MIL/uL (ref 3.80–5.20)
RDW: 13.4 % (ref 11.5–14.5)
WBC: 13.5 10*3/uL — AB (ref 3.6–11.0)

## 2016-09-08 LAB — TROPONIN I: TROPONIN I: 0.04 ng/mL — AB (ref <0.03)

## 2016-09-08 LAB — LIPASE, BLOOD: Lipase: 29 U/L (ref 11–51)

## 2016-09-08 MED ORDER — ONDANSETRON HCL 4 MG/2ML IJ SOLN
4.0000 mg | Freq: Once | INTRAMUSCULAR | Status: AC
  Administered 2016-09-08: 4 mg via INTRAVENOUS

## 2016-09-08 MED ORDER — SUCRALFATE 1 G PO TABS: 1.0000 g | ORAL_TABLET | Freq: Three times a day (TID) | ORAL | 0 refills | Status: DC

## 2016-09-08 MED ORDER — ONDANSETRON HCL 4 MG/2ML IJ SOLN
4.0000 mg | Freq: Once | INTRAMUSCULAR | Status: DC | PRN
  Filled 2016-09-08: qty 2

## 2016-09-08 MED ORDER — METOCLOPRAMIDE HCL 10 MG PO TABS: 10.0000 mg | ORAL_TABLET | Freq: Three times a day (TID) | ORAL | 0 refills | Status: DC | PRN

## 2016-09-08 MED ORDER — FAMOTIDINE 20 MG PO TABS
40.0000 mg | ORAL_TABLET | Freq: Once | ORAL | Status: AC
  Administered 2016-09-08: 40 mg via ORAL
  Filled 2016-09-08: qty 2

## 2016-09-08 MED ORDER — SODIUM CHLORIDE 0.9 % IV BOLUS (SEPSIS)
1000.0000 mL | Freq: Once | INTRAVENOUS | Status: AC
  Administered 2016-09-08: 1000 mL via INTRAVENOUS

## 2016-09-08 MED ORDER — SUCRALFATE 1 G PO TABS
1.0000 g | ORAL_TABLET | Freq: Once | ORAL | Status: AC
  Administered 2016-09-08: 1 g via ORAL
  Filled 2016-09-08: qty 1

## 2016-09-08 MED ORDER — GI COCKTAIL ~~LOC~~
30.0000 mL | Freq: Once | ORAL | Status: AC
  Administered 2016-09-08: 30 mL via ORAL
  Filled 2016-09-08: qty 30

## 2016-09-08 NOTE — ED Notes (Signed)
Pt admits to sometimes sticking her finger in her throat to cause herself to vomit. Tried to do this x 1 prior to taking meds.

## 2016-09-08 NOTE — Discharge Instructions (Signed)
Please follow-up with your doctor for further evaluation of this chest pain. Please continue taking medications daily.

## 2016-09-08 NOTE — ED Triage Notes (Signed)
Patient ambulatory to triage with steady gait, without difficulty or distress noted; pt reports onset left sided CP x 2 hrs accomp by vomiting; denies hx of same

## 2016-09-08 NOTE — ED Provider Notes (Signed)
[facility] Emergency Department Provider Note   ____________________________________________   First MD Initiated Contact with Patient 09/08/16 [phone]     (approximate)  I have reviewed the triage vital signs and the nursing notes.   HISTORY  Chief Complaint Chest Pain    HPI Emily Hansen is a 21 y.o. female who comes to the hospital today with some chest pain and abdominal pain. The patient reports that she ate 2 hours ago and developed some sharp pain in her mid chest. She reports that she ate cheese pizza. She reports she is also had some nausea and vomiting. The patient has had similar symptoms before but reports that it usually after eating that it usually gets better after she vomits. The patient states that the pain is sharp and burning at the same time. She's had some shortness of breath. She reports that it hurts when she breathes out not in. The patient reports she is also had a headache for days but denies any dizziness. The patient has abdominal pain all over her abdomen as well. The patient is here today for evaluation.   Past Medical History:  Diagnosis Date  . Bipolar disorder (HCC)   . SVT (supraventricular tachycardia) [facility])     Patient Active Problem List   Diagnosis Date Noted  . Bipolar I disorder, most recent episode (or current) manic, moderate (HCC)   . Major depressive disorder, recurrent, severe without psychotic features (HCC)   . 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 Bipolar 1 disorder, manic, moderate (HCC) 09/05/2014    History reviewed. No pertinent surgical history.  Prior to Admission medications   Medication Sig Start Date End Date Taking? Authorizing Provider  buPROPion (WELLBUTRIN XL) 300 MG 24 hr tablet Take 1 tablet (300 mg total) by mouth daily. For depression Patient not taking: Reported on 08/23/2015 09/12/14   Shuvon B Rankin, NP  clonazePAM (KLONOPIN) 1 MG tablet Take 1 mg by  mouth 2 (two) times daily.    Historical Provider, MD  drospirenone-ethinyl estradiol (YAZ,GIANVI,LORYNA) 3-0.02 MG tablet Take 1 tablet by mouth daily. Reported on 08/23/2015    Historical Provider, MD  gabapentin (NEURONTIN) 100 MG capsule Take 2 capsules (200 mg total) by mouth 3 (three) times daily. For agitation Patient not taking: Reported on 08/23/2015 09/12/14   Shuvon B Rankin, NP  metoCLOPramide (REGLAN) 10 MG tablet Take 1 tablet (10 mg total) by mouth every 8 (eight) hours as needed. 09/08/16   Rebecka Apley, MD  metoprolol tartrate (LOPRESSOR) 25 MG tablet Take 25 mg by mouth 1 day or 1 dose.    Historical Provider, MD  OLANZapine (ZYPREXA) 5 MG tablet Take 5 mg by mouth at bedtime.    Historical Provider, MD  pantoprazole (PROTONIX) 40 MG tablet Take 1 tablet (40 mg total) by mouth daily. For GERD Patient not taking: Reported on 08/23/2015 09/12/14   Shuvon B Rankin, NP  QUEtiapine (SEROQUEL) 100 MG tablet Take 1 tablet (100 mg total) by mouth at bedtime. For mood control/sleep Patient not taking: Reported on 08/23/2015 09/12/14   Shuvon B Rankin, NP  QUEtiapine (SEROQUEL) 25 MG tablet Take 0.5 tablets (12.5 mg total) by mouth every 8 (eight) hours as needed (Anxiety). Patient not taking: Reported on 08/23/2015 09/12/14   Shuvon B Rankin, NP  sucralfate (CARAFATE) 1 G tablet Take 1 tablet (1 g total) by mouth 4 (four) times daily -  with meals and at bedtime. For GERD Patient not  taking: Reported on 08/23/2015 09/12/14   Shuvon B Rankin, NP  sucralfate (CARAFATE) 1 g tablet Take 1 tablet (1 g total) by mouth 4 (four) times daily -  with meals and at bedtime. 09/08/16   Rebecka Apley, MD    Allergies Prozac [fluoxetine hcl]  No family history on file.  Social History Social History  Substance Use Topics  . Smoking status: Former Games developer  . Smokeless tobacco: Never Used     Comment: 2 packs per week  . Alcohol use 0.0 - 0.6 oz/week     Comment: has not drank in 2 weeks, social drinker      Review of Systems  Constitutional: No fever/chills Eyes: No visual changes. ENT: No sore throat. Cardiovascular:  chest pain. Respiratory: shortness of breath. Gastrointestinal: abdominal pain, nausea, vomiting.  No diarrhea.  No constipation. Genitourinary: Negative for dysuria. Musculoskeletal: Negative for back pain. Skin: Negative for rash. Neurological: Negative for headaches, focal weakness or numbness.   ____________________________________________   PHYSICAL EXAM:  VITAL SIGNS: ED Triage Vitals  Enc Vitals Group     BP 09/08/16 0003 (!) 117/97     Pulse Rate 09/08/16 0003 (!) 58     Resp 09/08/16 0003 (!) 22     Temp 09/08/16 0003 98 F (36.7 C)     Temp Source 09/08/16 0003 Oral     SpO2 09/08/16 0003 98 %     Weight 09/08/16 0002 117 lb (53.1 kg)     Height 09/08/16 0002  (1.651 m)     Head Circumference --      Peak Flow --      Pain Score 09/08/16 0002 10     Pain Loc --      Pain Edu? --      Excl. in GC? --     Constitutional: Alert and oriented. Well appearing and in moderate distress. Eyes: Conjunctivae are normal. PERRL. EOMI. Head: Atraumatic. Nose: No congestion/rhinnorhea. Mouth/Throat: Mucous membranes are moist.  Oropharynx non-erythematous. Cardiovascular: Normal rate, regular rhythm. Grossly normal heart sounds.  Good peripheral circulation. Respiratory: Normal respiratory effort.  No retractions. Lungs CTAB. Gastrointestinal: Soft with epigastric and LUQ abd pain No distention. Positive bowel sounds Musculoskeletal: No lower extremity tenderness nor edema.   Neurologic:  Normal speech and language.  Skin:  Skin is warm, dry and intact.  Psychiatric: Mood and affect are normal.   ____________________________________________   LABS (all labs ordered are listed, but only abnormal results are displayed)  Labs Reviewed  CBC - Abnormal; Notable for the following:       Result Value   WBC 13.5 (*)    All other components within  normal limits  TROPONIN I - Abnormal; Notable for the following:    Troponin I 0.04 (*)    All other components within normal limits  COMPREHENSIVE METABOLIC PANEL - Abnormal; Notable for the following:    Glucose, Bld 150 (*)    ALT 10 (*)    All other components within normal limits  LIPASE, BLOOD  TROPONIN I  URINALYSIS, COMPLETE (UACMP) WITH MICROSCOPIC  POC URINE PREG, ED   ____________________________________________  EKG  ED ECG REPORT I, Rebecka Apley, the attending physician, personally viewed and interpreted this ECG.   Date: 09/08/2016  EKG Time: 0008  Rate: 56  Rhythm: sinus bradycardia  Axis: normal  Intervals:none  ST&T Change: none ____________________________________________  RADIOLOGY  CXR ____________________________________________   PROCEDURES  Procedure(s) performed: None  Procedures  Critical  Care performed: No  ____________________________________________   INITIAL IMPRESSION / ASSESSMENT AND PLAN / ED COURSE  Pertinent labs & imaging results that were available during my care of the patient were reviewed by me and considered in my medical decision making (see chart for details).  This is a 21 year old who comes into the hospital today with some chest pain and abdominal pain. The patient reports that this started after she ate cheese pizza. She states it is burning and sharp at the same time. I did order a GI cocktail for the patient as well as some blood work. The patient's blood work is unremarkable. The patient also did receive some Pepcid as well as Carafate for her pain. The nurse did inform me that the patient was sticking her finger down her throat and an effort to vomit and we encouraged her not to do that as it will cause her pain to be worse as well as cause her not to be able to keep down the medications. I did order a repeat troponin. The repeat troponin was negative.  Clinical Course as of Sep 09 550  Tue Sep 08, 2016    0127 No active cardiopulmonary disease. DG Chest 2 View [AW]    Clinical Course User Index [AW] Rebecka Apley, MD   I did give the patient some Carafate and Pepcid. When I went back in to check on the patient's suicidal And appeared well. She has not had any vomiting here in the emergency department. I informed her that I feel her symptoms are due to gastritis. The patient understands and agrees. I will give the patient a prescription for Carafate as well as Reglan for home. She should follow up with her primary care physician as well as a GI physician. Given the patient's association with pizza which includes tomato sauce again I feel that this is indicative of gastritis.  ____________________________________________   FINAL CLINICAL IMPRESSION(S) / ED DIAGNOSES  Final diagnoses:  Acute gastritis without hemorrhage, unspecified gastritis type  Chest pain, unspecified type      NEW MEDICATIONS STARTED DURING THIS VISIT:  New Prescriptions   METOCLOPRAMIDE (REGLAN) 10 MG TABLET    Take 1 tablet (10 mg total) by mouth every 8 (eight) hours as needed.   SUCRALFATE (CARAFATE) 1 G TABLET    Take 1 tablet (1 g total) by mouth 4 (four) times daily -  with meals and at bedtime.     Note:  This document was prepared using Dragon voice recognition software and may include unintentional dictation errors.    Rebecka Apley, MD 09/08/16 [phone]

## 2016-12-16 DIAGNOSIS — R87612 Low grade squamous intraepithelial lesion on cytologic smear of cervix (LGSIL): Secondary | ICD-10-CM

## 2016-12-16 HISTORY — DX: Low grade squamous intraepithelial lesion on cytologic smear of cervix (LGSIL): R87.612

## 2017-01-05 ENCOUNTER — Encounter: Payer: Self-pay | Admitting: Obstetrics and Gynecology

## 2017-01-05 ENCOUNTER — Ambulatory Visit (INDEPENDENT_AMBULATORY_CARE_PROVIDER_SITE_OTHER): Payer: BLUE CROSS/BLUE SHIELD | Admitting: Obstetrics and Gynecology

## 2017-01-05 VITALS — BP 100/70 | HR 60 | Ht 65.0 in | Wt 134.0 lb

## 2017-01-05 DIAGNOSIS — Z124 Encounter for screening for malignant neoplasm of cervix: Secondary | ICD-10-CM | POA: Diagnosis not present

## 2017-01-05 DIAGNOSIS — Z113 Encounter for screening for infections with a predominantly sexual mode of transmission: Secondary | ICD-10-CM | POA: Diagnosis not present

## 2017-01-05 DIAGNOSIS — Z01419 Encounter for gynecological examination (general) (routine) without abnormal findings: Secondary | ICD-10-CM | POA: Diagnosis not present

## 2017-01-05 DIAGNOSIS — Z3041 Encounter for surveillance of contraceptive pills: Secondary | ICD-10-CM | POA: Diagnosis not present

## 2017-01-05 DIAGNOSIS — N760 Acute vaginitis: Secondary | ICD-10-CM

## 2017-01-05 DIAGNOSIS — B9689 Other specified bacterial agents as the cause of diseases classified elsewhere: Secondary | ICD-10-CM

## 2017-01-05 LAB — POCT WET PREP WITH KOH
CLUE CELLS WET PREP PER HPF POC: POSITIVE
KOH Prep POC: POSITIVE — AB
TRICHOMONAS UA: NEGATIVE
YEAST WET PREP PER HPF POC: NEGATIVE

## 2017-01-05 MED ORDER — DROSPIRENONE-ETHINYL ESTRADIOL 3-0.02 MG PO TABS: 1.0000 | ORAL_TABLET | Freq: Every day | ORAL | 12 refills | Status: DC

## 2017-01-05 MED ORDER — METRONIDAZOLE 500 MG PO TABS: 500.0000 mg | ORAL_TABLET | Freq: Two times a day (BID) | ORAL | 0 refills | Status: AC

## 2017-01-05 NOTE — Progress Notes (Signed)
Chief Complaint  Patient presents with  . Gynecologic Exam     HPI:      [name] is a 21 y.o. G0P0000 who LMP was Patient's last menstrual period was 12/04/2016., presents today for her annual examination.  Her menses are regular every 28-30 days, lasting 7 days.  Dysmenorrhea moderate, doesn't take meds because they don't work. Sometimes has to miss work due to pain. Sx controlled with OCPs in the past and wants to restart them. She does not have intermenstrual bleeding.  Sex activity: single partner, contraception - condoms sometimes. She ran out of OCP Rx and wants to restart them.  Last Pap: May 14, 2015  Results were: no abnormalities  Hx of STDs: none She has noticed increased vag d/c with odor, no irritation for a month or so. No recent abx use.   There is no FH of breast cancer. There is a FH of ovarian cancer in her [facility], genetic testing not indcated. The patient does not do self-breast exams.  Tobacco use: few cigs daily Alcohol use: few drinks weekly Marijuana daily for anxiety, can't afford klonopin currently.  Exercise: not active  She does get adequate calcium and Vitamin D in her diet.   Past Medical History:  Diagnosis Date  . Anxiety   . Bipolar disorder (HCC)   . Headache   . Mild depression (HCC)   . SVT (supraventricular tachycardia) (HCC)   . Vaccine for human papilloma virus (HPV) types 6, 11, 16, and 18 administered    complete    History reviewed. No pertinent surgical history.  Family History  Problem Relation Age of Onset  . Bipolar disorder Father   . Ovarian cancer Other     Social History   Social History  . Marital status: Single    Spouse name: N/A  . Number of children: N/A  . Years of education: N/A   Occupational History  . Not on file.   Social History Main Topics  . Smoking status: Current Every Day Smoker    Packs/day: 0.10    Types: Cigarettes  . Smokeless tobacco: Never Used     Comment: 2 packs per  week  . Alcohol use 0.0 - 0.6 oz/week     Comment: has not drank in 2 weeks, social drinker  . Drug use: Yes    Types: Marijuana     Comment: daily use  . Sexual activity: Yes    Birth control/ protection: Condom     Comment: sometimes   Other Topics Concern  . Not on file   Social History Narrative  . No narrative on file     Current Outpatient Prescriptions:  .  metoprolol succinate (TOPROL-XL) 25 MG 24 hr tablet, Take by mouth., Disp: , Rfl:  .  buPROPion (WELLBUTRIN XL) 300 MG 24 hr tablet, Take 1 tablet (300 mg total) by mouth daily. For depression (Patient not taking: Reported on 08/23/2015), Disp: 30 tablet, Rfl: 0 .  clonazePAM (KLONOPIN) 1 MG tablet, Take 1 mg by mouth 2 (two) times daily., Disp: , Rfl:  .  drospirenone-ethinyl estradiol (YAZ,GIANVI,LORYNA) 3-0.02 MG tablet, Take 1 tablet by mouth daily. Reported on 08/23/2015, Disp: 1 Package, Rfl: 12 .  gabapentin (NEURONTIN) 100 MG capsule, Take 2 capsules (200 mg total) by mouth 3 (three) times daily. For agitation (Patient not taking: Reported on 08/23/2015), Disp: 180 capsule, Rfl: 0 .  metoCLOPramide (REGLAN) 10 MG tablet, Take 1 tablet (10 mg total) by mouth  every 8 (eight) hours as needed. (Patient not taking: Reported on 01/05/2017), Disp: 20 tablet, Rfl: 0 .  metoprolol tartrate (LOPRESSOR) 25 MG tablet, Take 25 mg by mouth 1 day or 1 dose., Disp: , Rfl:  .  metroNIDAZOLE (FLAGYL) 500 MG tablet, Take 1 tablet (500 mg total) by mouth 2 (two) times daily., Disp: 14 tablet, Rfl: 0 .  OLANZapine (ZYPREXA) 5 MG tablet, Take 5 mg by mouth at bedtime., Disp: , Rfl:  .  pantoprazole (PROTONIX) 40 MG tablet, Take 1 tablet (40 mg total) by mouth daily. For GERD (Patient not taking: Reported on 08/23/2015), Disp: 30 tablet, Rfl: 0 .  QUEtiapine (SEROQUEL) 100 MG tablet, Take 1 tablet (100 mg total) by mouth at bedtime. For mood control/sleep (Patient not taking: Reported on 08/23/2015), Disp: 30 tablet, Rfl: 0 .  QUEtiapine (SEROQUEL) 25  MG tablet, Take 0.5 tablets (12.5 mg total) by mouth every 8 (eight) hours as needed (Anxiety). (Patient not taking: Reported on 08/23/2015), Disp: 15 tablet, Rfl: 0 .  sucralfate (CARAFATE) 1 G tablet, Take 1 tablet (1 g total) by mouth 4 (four) times daily -  with meals and at bedtime. For GERD (Patient not taking: Reported on 08/23/2015), Disp: 120 tablet, Rfl: 0 .  sucralfate (CARAFATE) 1 g tablet, Take 1 tablet (1 g total) by mouth 4 (four) times daily -  with meals and at bedtime. (Patient not taking: Reported on 01/05/2017), Disp: 20 tablet, Rfl: 0  ROS:  Review of Systems  Constitutional: Negative for fatigue, fever and unexpected weight change.  Respiratory: Negative for cough, shortness of breath and wheezing.   Cardiovascular: Negative for chest pain, palpitations and leg swelling.  Gastrointestinal: Negative for blood in stool, constipation, diarrhea, nausea and vomiting.  Endocrine: Negative for cold intolerance, heat intolerance and polyuria.  Genitourinary: Positive for vaginal discharge. Negative for dyspareunia, dysuria, flank pain, frequency, genital sores, hematuria, menstrual problem, pelvic pain, urgency, vaginal bleeding and vaginal pain.  Musculoskeletal: Negative for back pain, joint swelling and myalgias.  Skin: Negative for rash.  Neurological: Negative for dizziness, syncope, light-headedness, numbness and headaches.  Hematological: Negative for adenopathy.  Psychiatric/Behavioral: Negative for agitation, confusion, sleep disturbance and suicidal ideas. The patient is not nervous/anxious.      Objective: BP 100/70   Pulse 60   Ht 5\' 5"  (1.651 m)   Wt 134 lb (60.8 kg)   LMP 12/04/2016   BMI 22.30 kg/m    Physical Exam  Constitutional: She is oriented to person, place, and time. She appears well-developed and well-nourished.  Genitourinary: Vagina normal and uterus normal. There is no rash or tenderness on the right labia. There is no rash or tenderness on the  left labia. No erythema or tenderness in the vagina. No vaginal discharge found. Right adnexum does not display mass and does not display tenderness. Left adnexum does not display mass and does not display tenderness. Cervix does not exhibit motion tenderness or polyp. Uterus is not enlarged or tender.  Neck: Normal range of motion. No thyromegaly present.  Cardiovascular: Normal rate, regular rhythm and normal heart sounds.   No murmur heard. Pulmonary/Chest: Effort normal and breath sounds normal. Right breast exhibits no mass, no nipple discharge, no skin change and no tenderness. Left breast exhibits no mass, no nipple discharge, no skin change and no tenderness.  Abdominal: Soft. There is no tenderness. There is no guarding.  Musculoskeletal: Normal range of motion.  Neurological: She is alert and oriented to person, place, and time.  No cranial nerve deficit.  Psychiatric: She has a normal mood and affect. Her behavior is normal.  Vitals reviewed.   Results: Results for orders placed or performed in visit on 01/05/17 (from the past 24 hour(s))  POCT Wet Prep with KOH     Status: Abnormal   Collection Time: 01/05/17 11:11 AM  Result Value Ref Range   Trichomonas, UA Negative    Clue Cells Wet Prep HPF POC pos    Epithelial Wet Prep HPF POC  Few, Moderate, Many, Too numerous to count   Yeast Wet Prep HPF POC neg    Bacteria Wet Prep HPF POC  Few   RBC Wet Prep HPF POC     WBC Wet Prep HPF POC     KOH Prep POC Positive (A) Negative    Assessment/Plan: Encounter for annual routine gynecological examination  Cervical cancer screening - Plan: IGP,CtNgTv,rfx Aptima HPV ASCU  Screening for STD (sexually transmitted disease) - Plan: IGP,CtNgTv,rfx Aptima HPV ASCU  Encounter for surveillance of contraceptive pills - OCP restart with next menses. Condoms.  - Plan: drospirenone-ethinyl estradiol (YAZ,GIANVI,LORYNA) 3-0.02 MG tablet  Bacterial vaginosis - Rx flagyl. No EtOH. Will RF if sx  recur. - Plan: metroNIDAZOLE (FLAGYL) 500 MG tablet, POCT Wet Prep with KOH  Meds ordered this encounter  Medications  . metoprolol succinate (TOPROL-XL) 25 MG 24 hr tablet    Sig: Take by mouth.  . metroNIDAZOLE (FLAGYL) 500 MG tablet    Sig: Take 1 tablet (500 mg total) by mouth 2 (two) times daily.    Dispense:  14 tablet    Refill:  0  . drospirenone-ethinyl estradiol (YAZ,GIANVI,LORYNA) 3-0.02 MG tablet    Sig: Take 1 tablet by mouth daily. Reported on 08/23/2015    Dispense:  1 Package    Refill:  12              GYN counsel STD prevention, adequate intake of calcium and vitamin D, diet and exercise, tobacco and marijuana cessation.     F/U  Return in about 1 year (around 01/05/2018).  Blue Winther B. Octavius Shin, PA-C 01/05/2017 11:13 AM

## 2017-01-07 LAB — IGP,CTNGTV,RFX APTIMA HPV ASCU
CHLAMYDIA, NUC. ACID AMP: NEGATIVE
GONOCOCCUS, NUC. ACID AMP: NEGATIVE
PAP Smear Comment: 0
Trich vag by NAA: NEGATIVE

## 2017-01-13 ENCOUNTER — Encounter: Payer: Self-pay | Admitting: Obstetrics and Gynecology

## 2017-03-06 ENCOUNTER — Emergency Department: Payer: BLUE CROSS/BLUE SHIELD

## 2017-03-06 ENCOUNTER — Emergency Department
Admission: EM | Admit: 2017-03-06 | Discharge: 2017-03-06 | Disposition: A | Discharge status: Home / Self Care | Payer: BLUE CROSS/BLUE SHIELD | Attending: Emergency Medicine | Admitting: Emergency Medicine

## 2017-03-06 DIAGNOSIS — F17210 Nicotine dependence, cigarettes, uncomplicated: Secondary | ICD-10-CM | POA: Insufficient documentation

## 2017-03-06 DIAGNOSIS — S40811A Abrasion of right upper arm, initial encounter: Secondary | ICD-10-CM | POA: Diagnosis not present

## 2017-03-06 DIAGNOSIS — F419 Anxiety disorder, unspecified: Secondary | ICD-10-CM | POA: Diagnosis not present

## 2017-03-06 DIAGNOSIS — F319 Bipolar disorder, unspecified: Secondary | ICD-10-CM | POA: Insufficient documentation

## 2017-03-06 DIAGNOSIS — S0083XA Contusion of other part of head, initial encounter: Secondary | ICD-10-CM | POA: Insufficient documentation

## 2017-03-06 DIAGNOSIS — F1220 Cannabis dependence, uncomplicated: Secondary | ICD-10-CM | POA: Diagnosis not present

## 2017-03-06 DIAGNOSIS — Y998 Other external cause status: Secondary | ICD-10-CM | POA: Insufficient documentation

## 2017-03-06 DIAGNOSIS — S40021A Contusion of right upper arm, initial encounter: Secondary | ICD-10-CM

## 2017-03-06 DIAGNOSIS — Y929 Unspecified place or not applicable: Secondary | ICD-10-CM | POA: Diagnosis not present

## 2017-03-06 DIAGNOSIS — S40812A Abrasion of left upper arm, initial encounter: Secondary | ICD-10-CM | POA: Diagnosis not present

## 2017-03-06 DIAGNOSIS — S80812A Abrasion, left lower leg, initial encounter: Secondary | ICD-10-CM | POA: Insufficient documentation

## 2017-03-06 DIAGNOSIS — T07XXXA Unspecified multiple injuries, initial encounter: Secondary | ICD-10-CM

## 2017-03-06 DIAGNOSIS — Y9389 Activity, other specified: Secondary | ICD-10-CM | POA: Insufficient documentation

## 2017-03-06 DIAGNOSIS — S0990XA Unspecified injury of head, initial encounter: Secondary | ICD-10-CM | POA: Diagnosis present

## 2017-03-06 DIAGNOSIS — Z79899 Other long term (current) drug therapy: Secondary | ICD-10-CM | POA: Diagnosis not present

## 2017-03-06 DIAGNOSIS — S80811A Abrasion, right lower leg, initial encounter: Secondary | ICD-10-CM | POA: Diagnosis not present

## 2017-03-06 DIAGNOSIS — Z91040 Latex allergy status: Secondary | ICD-10-CM | POA: Insufficient documentation

## 2017-03-06 LAB — BASIC METABOLIC PANEL
ANION GAP: 7 (ref 5–15)
BUN: 10 mg/dL (ref 6–20)
CHLORIDE: 105 mmol/L (ref 101–111)
CO2: 26 mmol/L (ref 22–32)
Calcium: 9 mg/dL (ref 8.9–10.3)
Creatinine, Ser: 0.76 mg/dL (ref 0.44–1.00)
GFR calc Af Amer: >60 mL/min (ref >60)
GFR calc non Af Amer: >60 mL/min (ref >60)
GLUCOSE: 92 mg/dL (ref 65–99)
POTASSIUM: 4 mmol/L (ref 3.5–5.1)
Sodium: 138 mmol/L (ref 135–145)

## 2017-03-06 LAB — CBC WITH DIFFERENTIAL/PLATELET
BASOS ABS: 0 10*3/uL (ref 0–0.1)
Basophils Relative: 0 %
Eosinophils Absolute: 0 10*3/uL (ref 0–0.7)
Eosinophils Relative: 0 %
HEMATOCRIT: 39.6 % (ref 35.0–47.0)
Hemoglobin: 13.7 g/dL (ref 12.0–16.0)
LYMPHS ABS: 2.2 10*3/uL (ref 1.0–3.6)
LYMPHS PCT: 23 %
MCH: 32.6 pg (ref 26.0–34.0)
MCHC: 34.7 g/dL (ref 32.0–36.0)
MCV: 94 fL (ref 80.0–100.0)
MONO ABS: 0.6 10*3/uL (ref 0.2–0.9)
Monocytes Relative: 6 %
NEUTROS ABS: 6.5 10*3/uL (ref 1.4–6.5)
Neutrophils Relative %: 71 %
Platelets: 161 10*3/uL (ref 150–440)
RBC: 4.21 MIL/uL (ref 3.80–5.20)
RDW: 12.8 % (ref 11.5–14.5)
WBC: 9.4 10*3/uL (ref 3.6–11.0)

## 2017-03-06 LAB — POCT PREGNANCY, URINE: PREG TEST UR: NEGATIVE

## 2017-03-06 MED ORDER — HYDROCODONE-ACETAMINOPHEN 5-325 MG PO TABS: 1.0000 | ORAL_TABLET | Freq: Four times a day (QID) | ORAL | 0 refills | Status: DC | PRN

## 2017-03-06 MED ORDER — IBUPROFEN 600 MG PO TABS: 600.0000 mg | ORAL_TABLET | Freq: Three times a day (TID) | ORAL | 0 refills | Status: DC | PRN

## 2017-03-06 MED ORDER — IOPAMIDOL (ISOVUE-370) INJECTION 76%
75.0000 mL | Freq: Once | INTRAVENOUS | Status: AC | PRN
  Administered 2017-03-06: 75 mL via INTRAVENOUS

## 2017-03-06 MED ORDER — IBUPROFEN 600 MG PO TABS
600.0000 mg | ORAL_TABLET | Freq: Once | ORAL | Status: AC
  Administered 2017-03-06: 600 mg via ORAL
  Filled 2017-03-06: qty 1
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
## 2017-03-06 NOTE — ED Notes (Signed)
SANE RN taking pictures. Will start IV and notify CT when ready to complete. Grandmother at bedside.

## 2017-03-06 NOTE — ED Notes (Signed)
Spoke with SANE RN, Sherrie. Sherrie reported once the patient has been medically cleared to call Financial planneroncoming SANE RN. She further reported that she would inform oncoming RN about the patient.

## 2017-03-06 NOTE — ED Notes (Signed)
Report received from Jenna RN

## 2017-03-06 NOTE — Discharge Instructions (Signed)
° ° ° ° °Interpersonal Violence  ° °Interpersonal Violence aka Domestic Violence is defined as violence between people who have had a personal relationship. For example, someone you have ever dated, been married to or in a domestic partnership with. Someone with whom you have a child in common, or a current  household member.  °Does one or more of the following  °• attempts to cause bodily injury, or intentionally causes bodily injury; °• places you or a member of your family or household in fear of imminent serious bodily injury; °• continued harassment that rises to such a level as to inflict substantial emotional distress; or °• commits any rape or sexual offense  °You are not alone. Unfortunately domestic violence is very common. Domestic violence does not go away on its own and tends to get worse over time and more frequent. There are people who can help. There are resources included in these instructions. °Evidence can be collected in case you want to notify law enforcement now or in the future. A forensic nurse can take photographs and create a medical/legal document of the incident. If you choose to report to law enforcement, they will request a copy of the chart which we can provide with your permission. We can call in social work or an advocate to help with safety planning and emergency placement in a shelter if you have no other safe options. ° °THE POLICE CAN HELP YOU:  °• Get to a safe place away from the violence.  °• Get information on how the court can help protect you against the violence.  °• Get necessary belongings from your home for you and your children.  °• Get copies of police reports about the violence.  °• File a complaint in criminal court.  °• Find where local criminal and family courts are located. ° ° ° ° ° ° ° ° ° ° ° ° °[facility] Can Help You °• Safety Planning °• Assistance with Shelter °• Obtaining a Protective Order (50B) °• Court Advocacy and  Accompaniment °• Support Group °• Legal Assistance °• Assistance with domestic violence related criminal charges °• Child Protective Services °• Supervised Visitation °• Financial Assistance Enrollment °• Job Readiness °• Budget Counseling  °• Coaching and Mentoring ° °Call your local domestic violence program for additional information and support.  ° °[facility] [place]   336-641-SAFE °Crisis Line 336-273-7273 °[facility] [place] [facility]   336-570-6019 °Crisis Line 336-226-5985 °[facility] [place] [phone] ° °National Domestic Violence Abuse Hotline  °1-800-799-7233 ° ° ° °Soft Tissue Injury of the Neck (Strangulation) ° °A soft tissue injury of the neck is serious and needs medical care right away.  Some injuries do not break the skin (blunt injury).  Some injuries do break the skin (penetrating injury) and create an open wound.  You may feel fine at first, but the puffiness (swelling) in your throat can slowly make it harder to breathe.  This could cause serious or life-threatening injury.  There could be damage to major blood vessels and nerves in the neck.   ° °Be sure to tell your health care provider how the injury occurred and if someone else caused the injury.  Also, tell the provider if any object or hands were used to cause the injury (such as rope, clothesline, telephone cord, etc).   ° °Home Care °Get help right away if: °· Your voice gets weaker or hoarse °· Your puffiness or   bruising does not get better °· You have new puffiness or bruising in the face or neck °· Your pain gets worse  °· You have trouble swallowing °· You cough up blood °· You have trouble breathing °· You start to drool °· You start throwing up (vomiting) °· You have a fever of 102 degrees ° °Adapted from [employer] Patient Information ° ° ° °
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
## 2017-03-06 NOTE — ED Notes (Signed)
Patient transported to CT 
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
## 2017-03-17 ENCOUNTER — Other Ambulatory Visit: Payer: Self-pay | Admitting: Obstetrics and Gynecology

## 2017-03-17 DIAGNOSIS — B9689 Other specified bacterial agents as the cause of diseases classified elsewhere: Secondary | ICD-10-CM

## 2017-03-17 DIAGNOSIS — N760 Acute vaginitis: Principal | ICD-10-CM

## 2017-03-20 ENCOUNTER — Emergency Department: Payer: BLUE CROSS/BLUE SHIELD

## 2017-03-20 ENCOUNTER — Emergency Department
Admission: EM | Admit: 2017-03-20 | Discharge: 2017-03-20 | Disposition: A | Discharge status: Home / Self Care | Payer: BLUE CROSS/BLUE SHIELD | Attending: Emergency Medicine | Admitting: Emergency Medicine

## 2017-03-20 ENCOUNTER — Encounter: Payer: Self-pay | Admitting: Emergency Medicine

## 2017-03-20 DIAGNOSIS — F419 Anxiety disorder, unspecified: Secondary | ICD-10-CM | POA: Insufficient documentation

## 2017-03-20 DIAGNOSIS — M5489 Other dorsalgia: Secondary | ICD-10-CM | POA: Insufficient documentation

## 2017-03-20 DIAGNOSIS — F17210 Nicotine dependence, cigarettes, uncomplicated: Secondary | ICD-10-CM | POA: Diagnosis not present

## 2017-03-20 DIAGNOSIS — Z79899 Other long term (current) drug therapy: Secondary | ICD-10-CM | POA: Diagnosis not present

## 2017-03-20 DIAGNOSIS — Z91040 Latex allergy status: Secondary | ICD-10-CM | POA: Insufficient documentation

## 2017-03-20 DIAGNOSIS — R079 Chest pain, unspecified: Secondary | ICD-10-CM | POA: Diagnosis not present

## 2017-03-20 DIAGNOSIS — M549 Dorsalgia, unspecified: Secondary | ICD-10-CM

## 2017-03-20 LAB — CBC WITH DIFFERENTIAL/PLATELET
BASOS ABS: 0 10*3/uL (ref 0–0.1)
Basophils Relative: 0 %
EOS PCT: 0 %
Eosinophils Absolute: 0 10*3/uL (ref 0–0.7)
HCT: 38.3 % (ref 35.0–47.0)
Hemoglobin: 12.8 g/dL (ref 12.0–16.0)
LYMPHS ABS: 1 10*3/uL (ref 1.0–3.6)
LYMPHS PCT: 8 %
MCH: 32 pg (ref 26.0–34.0)
MCHC: 33.4 g/dL (ref 32.0–36.0)
MCV: 95.8 fL (ref 80.0–100.0)
MONO ABS: 0.7 10*3/uL (ref 0.2–0.9)
MONOS PCT: 6 %
NEUTROS ABS: 10.9 10*3/uL — AB (ref 1.4–6.5)
Neutrophils Relative %: 86 %
PLATELETS: 179 10*3/uL (ref 150–440)
RBC: 4 MIL/uL (ref 3.80–5.20)
RDW: 13.6 % (ref 11.5–14.5)
WBC: 12.6 10*3/uL — ABNORMAL HIGH (ref 3.6–11.0)

## 2017-03-20 LAB — BASIC METABOLIC PANEL
Anion gap: 12 (ref 5–15)
BUN: 9 mg/dL (ref 6–20)
CALCIUM: 9.2 mg/dL (ref 8.9–10.3)
CO2: 26 mmol/L (ref 22–32)
CREATININE: 0.69 mg/dL (ref 0.44–1.00)
Chloride: 97 mmol/L — ABNORMAL LOW (ref 101–111)
GFR calc non Af Amer: >60 mL/min (ref >60)
Glucose, Bld: 100 mg/dL — ABNORMAL HIGH (ref 65–99)
Potassium: 3.9 mmol/L (ref 3.5–5.1)
SODIUM: 135 mmol/L (ref 135–145)

## 2017-03-20 LAB — TROPONIN I

## 2017-03-20 LAB — FIBRIN DERIVATIVES D-DIMER (ARMC ONLY): FIBRIN DERIVATIVES D-DIMER (ARMC): 312.12 ng{FEU}/mL (ref 0.00–499.00)

## 2017-03-20 MED ORDER — LORAZEPAM 1 MG PO TABS
1.0000 mg | ORAL_TABLET | Freq: Once | ORAL | Status: AC
  Administered 2017-03-20: 1 mg via ORAL
  Filled 2017-03-20: qty 1

## 2017-03-20 NOTE — ED Triage Notes (Addendum)
Pt arrived via pov with complaints of shortness of breath and upper back pain. Pt reports pain is worse when breathing. Pt's breathing unlabored and oxygen saturation 98%. Pt tearful in triage and reports drinking tonight. Pt states pain and shortness of breath were overwhelming symptoms that happened suddenly around 0430 this morning. Pt denies and SI/HI but does report she is under "a lot of stress right now."
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
## 2017-04-16 ENCOUNTER — Other Ambulatory Visit
Admission: RE | Admit: 2017-04-16 | Discharge: 2017-04-16 | Disposition: A | Discharge status: Home / Self Care | Payer: BLUE CROSS/BLUE SHIELD | Source: Other Acute Inpatient Hospital | Attending: Internal Medicine | Admitting: Internal Medicine

## 2017-04-16 DIAGNOSIS — R000 Tachycardia, unspecified: Secondary | ICD-10-CM | POA: Insufficient documentation

## 2017-04-16 DIAGNOSIS — R0602 Shortness of breath: Secondary | ICD-10-CM | POA: Diagnosis not present

## 2017-04-16 DIAGNOSIS — R079 Chest pain, unspecified: Secondary | ICD-10-CM | POA: Insufficient documentation

## 2017-04-16 LAB — FIBRIN DERIVATIVES D-DIMER (ARMC ONLY): Fibrin derivatives D-dimer (ARMC): 180.14 ng/mL (FEU) (ref 0.00–499.00)

## 2018-01-08 ENCOUNTER — Emergency Department
Admission: EM | Admit: 2018-01-08 | Discharge: 2018-01-08 | Disposition: A | Discharge status: Home / Self Care | Payer: BLUE CROSS/BLUE SHIELD | Source: Home / Self Care | Attending: Emergency Medicine | Admitting: Emergency Medicine

## 2018-01-08 ENCOUNTER — Emergency Department: Payer: BLUE CROSS/BLUE SHIELD

## 2018-01-08 ENCOUNTER — Encounter: Payer: Self-pay | Admitting: Intensive Care

## 2018-01-08 ENCOUNTER — Emergency Department
Admission: EM | Admit: 2018-01-08 | Discharge: 2018-01-08 | Disposition: A | Discharge status: Home / Self Care | Payer: BLUE CROSS/BLUE SHIELD | Attending: Emergency Medicine | Admitting: Emergency Medicine

## 2018-01-08 ENCOUNTER — Other Ambulatory Visit: Payer: Self-pay

## 2018-01-08 ENCOUNTER — Encounter: Payer: Self-pay | Admitting: Emergency Medicine

## 2018-01-08 DIAGNOSIS — Z79899 Other long term (current) drug therapy: Secondary | ICD-10-CM | POA: Insufficient documentation

## 2018-01-08 DIAGNOSIS — R079 Chest pain, unspecified: Secondary | ICD-10-CM | POA: Insufficient documentation

## 2018-01-08 DIAGNOSIS — F17210 Nicotine dependence, cigarettes, uncomplicated: Secondary | ICD-10-CM | POA: Insufficient documentation

## 2018-01-08 DIAGNOSIS — R1084 Generalized abdominal pain: Secondary | ICD-10-CM | POA: Diagnosis not present

## 2018-01-08 DIAGNOSIS — R112 Nausea with vomiting, unspecified: Secondary | ICD-10-CM | POA: Diagnosis not present

## 2018-01-08 LAB — BASIC METABOLIC PANEL
Anion gap: 11 (ref 5–15)
BUN: 10 mg/dL (ref 6–20)
CHLORIDE: 102 mmol/L (ref 98–111)
CO2: 27 mmol/L (ref 22–32)
CREATININE: 0.69 mg/dL (ref 0.44–1.00)
Calcium: 9.3 mg/dL (ref 8.9–10.3)
GFR calc Af Amer: >60 mL/min (ref >60)
GFR calc non Af Amer: >60 mL/min (ref >60)
GLUCOSE: 142 mg/dL — AB (ref 70–99)
Potassium: 4.2 mmol/L (ref 3.5–5.1)
Sodium: 140 mmol/L (ref 135–145)

## 2018-01-08 LAB — CBC
HCT: 36.8 % (ref 35.0–47.0)
Hemoglobin: 13.1 g/dL (ref 12.0–16.0)
MCH: 33.3 pg (ref 26.0–34.0)
MCHC: 35.5 g/dL (ref 32.0–36.0)
MCV: 93.8 fL (ref 80.0–100.0)
PLATELETS: 170 10*3/uL (ref 150–440)
RBC: 3.92 MIL/uL (ref 3.80–5.20)
RDW: 12.8 % (ref 11.5–14.5)
WBC: 6.7 10*3/uL (ref 3.6–11.0)

## 2018-01-08 LAB — TROPONIN I: Troponin I: <0.03 ng/mL (ref <0.03)

## 2018-01-08 MED ORDER — ALBUTEROL SULFATE HFA 108 (90 BASE) MCG/ACT IN AERS: 2.0000 | INHALATION_SPRAY | Freq: Four times a day (QID) | RESPIRATORY_TRACT | 0 refills | Status: AC | PRN

## 2018-01-08 MED ORDER — ONDANSETRON 4 MG PO TBDP: 4.0000 mg | ORAL_TABLET | Freq: Three times a day (TID) | ORAL | 0 refills | Status: DC | PRN

## 2018-01-08 MED ORDER — SODIUM CHLORIDE 0.9 % IV BOLUS: 1000.0000 mL | Freq: Once | INTRAVENOUS | Status: DC

## 2018-01-08 MED ORDER — ONDANSETRON 4 MG PO TBDP
ORAL_TABLET | ORAL | Status: AC
  Filled 2018-01-08: qty 1

## 2018-01-08 MED ORDER — PROCHLORPERAZINE EDISYLATE 10 MG/2ML IJ SOLN
10.0000 mg | Freq: Once | INTRAMUSCULAR | Status: DC
  Filled 2018-01-08: qty 2

## 2018-01-08 MED ORDER — ONDANSETRON 4 MG PO TBDP
8.0000 mg | ORAL_TABLET | Freq: Once | ORAL | Status: AC
  Administered 2018-01-08: 8 mg via ORAL
  Filled 2018-01-08: qty 2

## 2018-01-08 MED ORDER — AZITHROMYCIN 250 MG PO TABS: ORAL_TABLET | ORAL | 0 refills | Status: AC

## 2018-01-08 NOTE — ED Notes (Signed)
Patient denies pain and is resting comfortably.  

## 2018-01-08 NOTE — ED Provider Notes (Signed)
[facility] Emergency Department Provider Note  ____________________________________________   First MD Initiated Contact with Patient 01/08/18 1208     (approximate)  I have reviewed the triage vital signs and the nursing notes.   HISTORY  Chief Complaint Emesis   HPI Emily Hansen is a 22 y.o. female who sent to the emergency department from [facility] for nausea and vomiting.  The patient reports drinking Christiane Ha last night prior to going to bed.  Throughout the course of the night she awoke and vomited multiple times.  She has had persistent burning throat and chest pain ever since associated with abdominal discomfort.  Her pain is moderate severity upper abdominal radiating up her chest.  She is been unable to keep down food or water.  She went to [facility] this morning who gave her a GI cocktail and Zofran and her symptoms persisted so she was advised to come to the emergency department.   Past Medical History:  Diagnosis Date  . Anxiety   . Bipolar disorder (HCC)   . Headache   . 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 Mild depression (HCC)   . SVT (supraventricular tachycardia) (HCC)   . Vaccine for human papilloma virus (HPV) types 6, 11, 16, and 18 administered    complete    Patient Active Problem List   Diagnosis Date Noted  . Bipolar I disorder, most recent episode (or current) manic, moderate (HCC)   . Major depressive disorder, recurrent, severe without psychotic features (HCC)   . 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 Bipolar 1 disorder, manic, moderate (HCC) 09/05/2014    History reviewed. No pertinent surgical history.  Prior to Admission medications   Medication Sig Start Date End Date Taking? Authorizing Provider  acetaminophen (TYLENOL) 500 MG tablet Take 500-1,000 mg by mouth every 6 (six) hours as needed.    [provider]  albuterol (PROVENTIL HFA;VENTOLIN HFA) 108  (90 Base) MCG/ACT inhaler Inhale 2 puffs into the lungs every 6 (six) hours as needed for wheezing or shortness of breath. 01/08/18   Minna Antis, MD  azithromycin (ZITHROMAX Z-PAK) 250 MG tablet Take 2 tablets (500 mg) on  Day 1,  followed by 1 tablet (250 mg) once daily on Days 2 through 5. 01/08/18 01/13/18  Minna Antis, MD  buPROPion (WELLBUTRIN XL) 300 MG 24 hr tablet Take 1 tablet (300 mg total) by mouth daily. For depression Patient not taking: Reported on 08/23/2015 09/12/14   Rankin, Shuvon B, NP  clonazePAM (KLONOPIN) 1 MG tablet Take 1-2 mg by mouth daily.     [provider]  drospirenone-ethinyl estradiol (YAZ,GIANVI,LORYNA) 3-0.02 MG tablet Take 1 tablet by mouth daily. Reported on 08/23/2015 01/05/17   Copland, Helmut Muster B, PA-C  gabapentin (NEURONTIN) 100 MG capsule Take 2 capsules (200 mg total) by mouth 3 (three) times daily. For agitation Patient not taking: Reported on 08/23/2015 09/12/14   Rankin, Shuvon B, NP  HYDROcodone-acetaminophen (NORCO/VICODIN) 5-325 MG tablet Take 1 tablet by mouth every 6 (six) hours as needed for moderate pain. 03/06/17   Governor Rooks, MD  ibuprofen (ADVIL,MOTRIN) 600 MG tablet Take 1 tablet (600 mg total) by mouth every 8 (eight) hours as needed. 03/06/17   Governor Rooks, MD  metoCLOPramide (REGLAN) 10 MG tablet Take 1 tablet (10 mg total) by mouth every 8 (eight) hours as needed. Patient not taking: Reported on 01/05/2017 09/08/16   Rebecka Apley, MD  metoprolol tartrate (  LOPRESSOR) 25 MG tablet Take 25 mg by mouth 1 day or 1 dose.    [provider]  OLANZapine-FLUoxetine (SYMBYAX) 6-25 MG capsule Take 1 capsule by mouth at bedtime.    [provider]  ondansetron (ZOFRAN ODT) 4 MG disintegrating tablet Take 1 tablet (4 mg total) by mouth every 8 (eight) hours as needed for nausea or vomiting. 01/08/18   Merrily Brittle, MD  pantoprazole (PROTONIX) 40 MG tablet Take 1 tablet (40 mg total) by mouth daily. For GERD Patient  not taking: Reported on 08/23/2015 09/12/14   Rankin, Shuvon B, NP  QUEtiapine (SEROQUEL) 100 MG tablet Take 1 tablet (100 mg total) by mouth at bedtime. For mood control/sleep Patient not taking: Reported on 08/23/2015 09/12/14   Rankin, Shuvon B, NP  QUEtiapine (SEROQUEL) 25 MG tablet Take 0.5 tablets (12.5 mg total) by mouth every 8 (eight) hours as needed (Anxiety). Patient not taking: Reported on 08/23/2015 09/12/14   Rankin, Shuvon B, NP  sucralfate (CARAFATE) 1 G tablet Take 1 tablet (1 g total) by mouth 4 (four) times daily -  with meals and at bedtime. For GERD Patient not taking: Reported on 08/23/2015 09/12/14   Rankin, Shuvon B, NP  sucralfate (CARAFATE) 1 g tablet Take 1 tablet (1 g total) by mouth 4 (four) times daily -  with meals and at bedtime. Patient not taking: Reported on 01/05/2017 09/08/16   Rebecka Apley, MD    Allergies Prozac [fluoxetine hcl] and Latex  Family History  Problem Relation Age of Onset  . Bipolar disorder Father   . Ovarian cancer Other     Social History Social History   Tobacco Use  . Smoking status: Current Every Day Smoker    Packs/day: 0.10    Types: Cigarettes  . Smokeless tobacco: Never Used  . Tobacco comment: 2 packs per week  Substance Use Topics  . Alcohol use: Yes    Alcohol/week: 0.0 - 1.0 standard drinks    Comment: has not drank in 2 weeks, social drinker  . Drug use: Yes    Types: Marijuana    Comment: daily use    Review of Systems Constitutional: No fever/chills Eyes: No visual changes. ENT: No sore throat. Cardiovascular: Positive for chest pain. Respiratory: Denies shortness of breath. Gastrointestinal: Positive for abdominal pain.  Positive for nausea, positive for vomiting.  No diarrhea.  No constipation. Genitourinary: Negative for dysuria. Musculoskeletal: Negative for back pain. Skin: Negative for rash. Neurological: Negative for headaches, focal weakness or  numbness.   ____________________________________________   PHYSICAL EXAM:  VITAL SIGNS: ED Triage Vitals [01/08/18 1058]  Enc Vitals Group     BP (!) 101/58     Pulse Rate 66     Resp 16     Temp 97.7 F (36.5 C)     Temp Source Oral     SpO2 100 %     Weight 130 lb (59 kg)     Height      Head Circumference      Peak Flow      Pain Score 0     Pain Loc      Pain Edu?      Excl. in GC?     Constitutional: Alert and oriented x4 pleasant cooperative speaks in full clear sentences Eyes: PERRL EOMI. Head: Atraumatic. Nose: No congestion/rhinnorhea. Mouth/Throat: No trismus Neck: No stridor.   Cardiovascular: Normal rate, regular rhythm. Grossly normal heart sounds.  Good peripheral circulation. Respiratory: Normal respiratory effort.  No retractions. Lungs CTAB and moving good air Gastrointestinal: Soft nondistended nontender no rebound or guarding no peritonitis no McBurney's tenderness negative Rovsing's Musculoskeletal: No lower extremity edema   Neurologic:  Normal speech and language. No gross focal neurologic deficits are appreciated. Skin:  Skin is warm, dry and intact. No rash noted. Psychiatric: Mood and affect are normal. Speech and behavior are normal.    ____________________________________________   DIFFERENTIAL includes but not limited to  Pancreatitis, dehydration, biliary colic, morning sickness ____________________________________________   LABS (all labs ordered are listed, but only abnormal results are displayed)  Labs Reviewed - No data to display  Lab work from [facility] reviewed by me shows the patient is not pregnant no acute disease __________________________________________  EKG   ____________________________________________  RADIOLOGY   ____________________________________________   PROCEDURES  Procedure(s) performed: no  Procedures  Critical Care performed:  no  ____________________________________________   INITIAL IMPRESSION / ASSESSMENT AND PLAN / ED COURSE  Pertinent labs & imaging results that were available during my care of the patient were reviewed by me and considered in my medical decision making (see chart for details).   As part of my medical decision making, I reviewed the following data within the electronic MEDICAL RECORD NUMBER History obtained from family if available, nursing notes, old chart and ekg, as well as notes from prior ED visits.  By the time I saw the patient she was completely asymptomatic and she was requesting to go home.  She said that "I think I did not give that Zofran long enough to work".  She is able to eat and drink and appears quite comfortable.  I reviewed her lab work from [facility] which is all unremarkable.  At this point I am comfortable discharging the patient home with a short course of Zofran and primary care follow-up.  Strict return precautions have been given.      ____________________________________________   FINAL CLINICAL IMPRESSION(S) / ED DIAGNOSES  Final diagnoses:  Generalized abdominal pain  Nausea and vomiting, intractability of vomiting not specified, unspecified vomiting type      NEW MEDICATIONS STARTED DURING THIS VISIT:  Discharge Medication List as of 01/08/2018 12:13 PM    START taking these medications   Details  ondansetron (ZOFRAN ODT) 4 MG disintegrating tablet Take 1 tablet (4 mg total) by mouth every 8 (eight) hours as needed for nausea or vomiting., Starting Sat 01/08/2018, Print         Note:  This document was prepared using Dragon voice recognition software and may include unintentional dictation errors.     Merrily Brittleifenbark, Daviona Herbert, MD 01/09/18 [phone]

## 2018-01-08 NOTE — ED Notes (Signed)
AAOx3.  Skin warm and dry.  Patient denies all complaints at this time.  States "I am feeling fine now.  I don't think I gave the medicine enough time to work".

## 2018-01-08 NOTE — ED Triage Notes (Signed)
Pt to ED via POV, pt was sent from [facility]. Pt states that she has been having nausea and vomiting as well as some chest pressure. Pt was given GI cocktail and Zofran 4 mg at [facility] but pt did not have any improvement so she was sent to the ED for evaluation. Pt is currently in NAD.

## 2018-01-08 NOTE — ED Notes (Signed)
Pt had blood work and UA done at [facility] this morning.

## 2018-01-08 NOTE — ED Notes (Addendum)
Water and crackers given to pt. For PO challenge per MD.

## 2018-01-08 NOTE — ED Notes (Signed)
AAOx3.  Skin warm and dry.  NAD 

## 2018-01-08 NOTE — Discharge Instructions (Signed)
Please make sure you remain well-hydrated today and follow-up with your primary care physician and gastroenterology as scheduled.  Return to the emergency department for any concerns.  It was a pleasure to take care of you today, and thank you for coming to our emergency department.  If you have any questions or concerns before leaving please ask the nurse to grab me and I'm more than happy to go through your aftercare instructions again.  If you were prescribed any opioid pain medication today such as Norco, Vicodin, Percocet, morphine, hydrocodone, or oxycodone please make sure you do not drive when you are taking this medication as it can alter your ability to drive safely.  If you have any concerns once you are home that you are not improving or are in fact getting worse before you can make it to your follow-up appointment, please do not hesitate to call 911 and come back for further evaluation.  Merrily BrittleNeil Vickey Boak, MD

## 2018-01-08 NOTE — ED Provider Notes (Signed)
[facility] Emergency Department Provider Note  Time seen: 5:36 PM  I have reviewed the triage vital signs and the nursing notes.   HISTORY  Chief Complaint Chest Pain    HPI Emily Hansen is a 22 y.o. female with a past medical history of anxiety, bipolar, depression, SVT, presents to the emergency department for chest pain.  According to the patient since last night she has been experiencing pain in her chest at times.  States she was seen in the ED earlier today for the pain associate with nausea and vomiting as well.  States she was feeling much better and she was discharged home however shortly after going home she ate chicken nuggets and once again developed chest pain, nausea and vomiting.  Patient admits that when the chest pain occurred and she became extremely anxious and had a "full-blown panic attack."  Patient states a history of chest pain since 2016 which has been occurring intermittently ever since.  Currently she feels much better.  Denies any pain at this time.  Patient does have a wet sounding cough states she has been coughing for the past 4 days.  Denies any fever.   Past Medical History:  Diagnosis Date  . Anxiety   . Bipolar disorder (HCC)   . Headache   . 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 Mild depression (HCC)   . SVT (supraventricular tachycardia) (HCC)   . Vaccine for human papilloma virus (HPV) types 6, 11, 16, and 18 administered    complete    Patient Active Problem List   Diagnosis Date Noted  . Bipolar I disorder, most recent episode (or current) manic, moderate (HCC)   . Major depressive disorder, recurrent, severe without psychotic features (HCC)   . 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 Bipolar 1 disorder, manic, moderate (HCC) 09/05/2014    History reviewed. No pertinent surgical history.  Prior to Admission medications   Medication Sig Start Date End Date Taking? Authorizing  Provider  acetaminophen (TYLENOL) 500 MG tablet Take 500-1,000 mg by mouth every 6 (six) hours as needed.    [provider]  buPROPion (WELLBUTRIN XL) 300 MG 24 hr tablet Take 1 tablet (300 mg total) by mouth daily. For depression Patient not taking: Reported on 08/23/2015 09/12/14   Rankin, Shuvon B, NP  clonazePAM (KLONOPIN) 1 MG tablet Take 1-2 mg by mouth daily.     [provider]  drospirenone-ethinyl estradiol (YAZ,GIANVI,LORYNA) 3-0.02 MG tablet Take 1 tablet by mouth daily. Reported on 08/23/2015 01/05/17   Copland, Helmut Muster B, PA-C  gabapentin (NEURONTIN) 100 MG capsule Take 2 capsules (200 mg total) by mouth 3 (three) times daily. For agitation Patient not taking: Reported on 08/23/2015 09/12/14   Rankin, Shuvon B, NP  HYDROcodone-acetaminophen (NORCO/VICODIN) 5-325 MG tablet Take 1 tablet by mouth every 6 (six) hours as needed for moderate pain. 03/06/17   Governor Rooks, MD  ibuprofen (ADVIL,MOTRIN) 600 MG tablet Take 1 tablet (600 mg total) by mouth every 8 (eight) hours as needed. 03/06/17   Governor Rooks, MD  metoCLOPramide (REGLAN) 10 MG tablet Take 1 tablet (10 mg total) by mouth every 8 (eight) hours as needed. Patient not taking: Reported on 01/05/2017 09/08/16   Rebecka Apley, MD  metoprolol tartrate (LOPRESSOR) 25 MG tablet Take 25 mg by mouth 1 day or 1 dose.    [provider]  OLANZapine-FLUoxetine (SYMBYAX) 6-25 MG capsule Take 1 capsule by mouth at  bedtime.    [provider]  ondansetron (ZOFRAN ODT) 4 MG disintegrating tablet Take 1 tablet (4 mg total) by mouth every 8 (eight) hours as needed for nausea or vomiting. 01/08/18   Merrily Brittleifenbark, Neil, MD  pantoprazole (PROTONIX) 40 MG tablet Take 1 tablet (40 mg total) by mouth daily. For GERD Patient not taking: Reported on 08/23/2015 09/12/14   Rankin, Shuvon B, NP  QUEtiapine (SEROQUEL) 100 MG tablet Take 1 tablet (100 mg total) by mouth at bedtime. For mood control/sleep Patient not taking: Reported  on 08/23/2015 09/12/14   Rankin, Shuvon B, NP  QUEtiapine (SEROQUEL) 25 MG tablet Take 0.5 tablets (12.5 mg total) by mouth every 8 (eight) hours as needed (Anxiety). Patient not taking: Reported on 08/23/2015 09/12/14   Rankin, Shuvon B, NP  sucralfate (CARAFATE) 1 G tablet Take 1 tablet (1 g total) by mouth 4 (four) times daily -  with meals and at bedtime. For GERD Patient not taking: Reported on 08/23/2015 09/12/14   Rankin, Shuvon B, NP  sucralfate (CARAFATE) 1 g tablet Take 1 tablet (1 g total) by mouth 4 (four) times daily -  with meals and at bedtime. Patient not taking: Reported on 01/05/2017 09/08/16   Rebecka ApleyWebster, Allison P, MD    Allergies  Allergen Reactions  . Prozac [Fluoxetine Hcl]   . Latex Rash    Family History  Problem Relation Age of Onset  . Bipolar disorder Father   . Ovarian cancer Other     Social History Social History   Tobacco Use  . Smoking status: Current Every Day Smoker    Packs/day: 0.10    Types: Cigarettes  . Smokeless tobacco: Never Used  . Tobacco comment: 2 packs per week  Substance Use Topics  . Alcohol use: Yes    Alcohol/week: 0.0 - 1.0 standard drinks    Comment: has not drank in 2 weeks, social drinker  . Drug use: Yes    Types: Marijuana    Comment: daily use    Review of Systems Constitutional: Negative for fever. Eyes: Negative for visual complaints ENT: All congestion x5 days Cardiovascular: Intermittent chest pain since 2016, worse last night and again today.  None currently. Respiratory: Negative for shortness of breath.  Positive for cough x5 days.  Denies any pleuritic chest pain. Gastrointestinal: Negative for abdominal pain positive for intermittent nausea and vomiting yesterday and today. Genitourinary: Negative for urinary compaints Musculoskeletal: Negative for leg pain or swelling. Skin: Negative for skin complaints  Neurological: Negative for headache All other ROS  negative  ____________________________________________   PHYSICAL EXAM:  VITAL SIGNS: ED Triage Vitals [01/08/18 1440]  Enc Vitals Group     BP 104/64     Pulse Rate (!) 57     Resp 16     Temp (!) 97.5 F (36.4 C)     Temp Source Oral     SpO2 100 %     Weight 130 lb (59 kg)     Height 5\' 7"  (1.702 m)     Head Circumference      Peak Flow      Pain Score 10     Pain Loc      Pain Edu?      Excl. in GC?    Constitutional: Alert and oriented. Well appearing and in no distress. Eyes: Normal exam ENT   Head: Normocephalic and atraumatic.   Mouth/Throat: Mucous membranes are moist. Cardiovascular: Normal rate, regular rhythm. No murmur Respiratory: Normal respiratory effort  without tachypnea nor retractions. Breath sounds are clear.  Chest is nontender. Gastrointestinal: Soft and nontender. No distention. Musculoskeletal: Nontender with normal range of motion in all extremities. No lower extremity tenderness or edema. Neurologic:  Normal speech and language. No gross focal neurologic deficits Skin:  Skin is warm, dry and intact.  Psychiatric: Mood and affect are normal.   ____________________________________________    EKG  EKG reviewed and interpreted by myself shows sinus rhythm at 78 bpm with a narrow QRS, normal axis, normal intervals, no concerning ST changes.  ____________________________________________    RADIOLOGY  Chest x-ray negative  ____________________________________________   INITIAL IMPRESSION / ASSESSMENT AND PLAN / ED COURSE  Pertinent labs & imaging results that were available during my care of the patient were reviewed by me and considered in my medical decision making (see chart for details).  Patient presents emergency department for chest pain, intermittent since yesterday along with intermittent nausea vomiting.  Patient admits to panic disorder and states she had a "full-blown panic attack" today.  Denies any shortness of breath  or chest pain at this time.  Patient does have a wet sounding cough with mild expiratory wheeze on examination.  I highly suspect the patient's chest pain is likely due to her cough.  Given the wheeze with mild rhonchi on exam we will start the patient on Zithromax and albuterol.  Patient states she has required albuterol in the past with good response.  Patient's chest x-ray is clear no signs of pneumonia.  Overall the patient appears extremely well with reassuring labs including a negative troponin.  Vitals are reassuring including heart rate of 57 and respiratory rate of 16 with 100% pulse oximetry.  No concern for PE.  Patient will be discharged home with PCP follow-up.  Patient has tolerated p.o. trial in the emergency department.  ____________________________________________   FINAL CLINICAL IMPRESSION(S) / ED DIAGNOSES  Chest pain Upper respiratory infection    Minna Antis, MD 01/08/18 1740

## 2018-01-08 NOTE — ED Triage Notes (Signed)
Patient c/o central sharp chest pain since midnight. Ambulatory in triage with no problems. Patient report over X30 emesis since midnight. Patient started crying and yelling out when first brought in triage room and now calm and collective answering questions.

## 2018-02-06 IMAGING — DX DG CHEST 1V
1 series · 1 of 1 positions shown · non-contrast
Comparison: 09/08/2016.

CLINICAL DATA: Shortness of breath.  Upper back pain.  Smoker.

EXAM:
CHEST 1 VIEW

[chest ap]
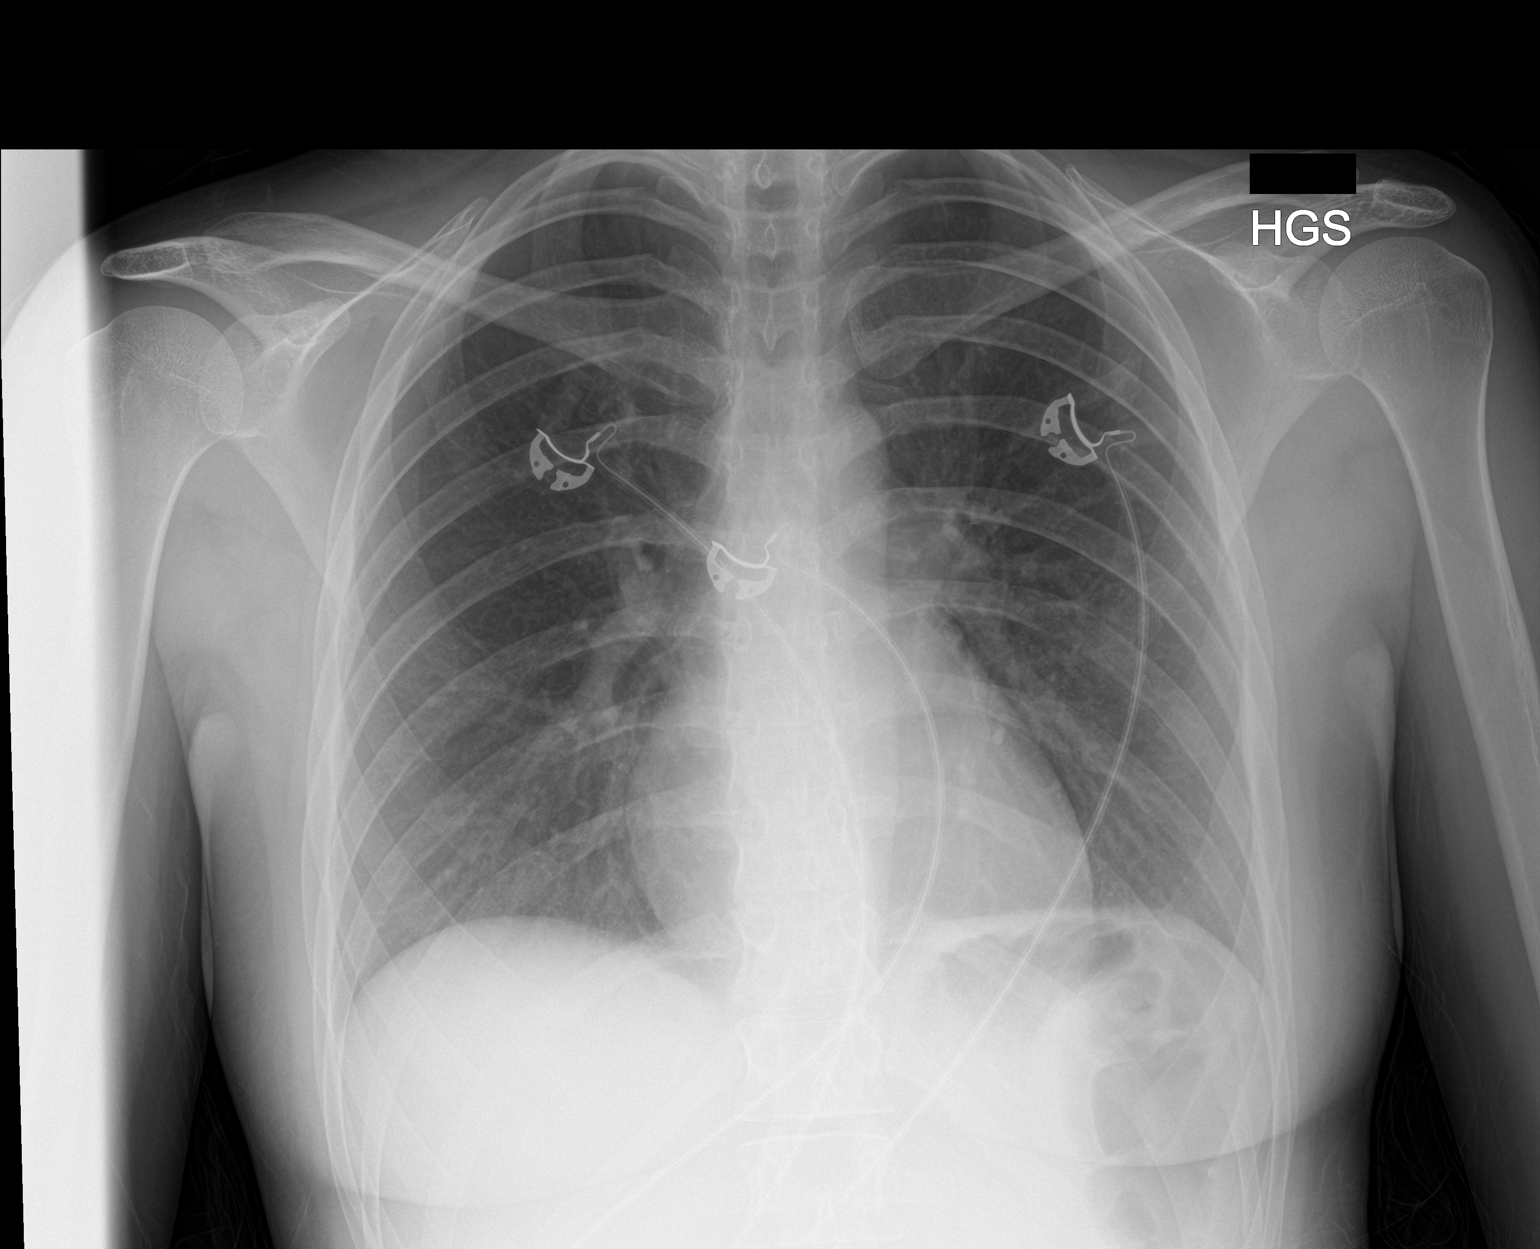

[1 of 1 positions shown; findings below may reference images not displayed]

FINDINGS: The heart size and mediastinal contours are within normal limits.
Both lungs are clear. The visualized skeletal structures are
unremarkable.
IMPRESSION: Normal examination.

## 2018-05-16 ENCOUNTER — Other Ambulatory Visit: Payer: Self-pay | Admitting: Obstetrics and Gynecology

## 2018-05-16 DIAGNOSIS — Z3041 Encounter for surveillance of contraceptive pills: Secondary | ICD-10-CM

## 2018-06-09 ENCOUNTER — Encounter: Payer: Self-pay | Admitting: Obstetrics and Gynecology

## 2018-06-09 ENCOUNTER — Other Ambulatory Visit (HOSPITAL_COMMUNITY)
Admission: RE | Admit: 2018-06-09 | Discharge: 2018-06-09 | Disposition: A | Discharge status: Home / Self Care | Payer: BLUE CROSS/BLUE SHIELD | Source: Ambulatory Visit | Attending: Obstetrics and Gynecology | Admitting: Obstetrics and Gynecology

## 2018-06-09 ENCOUNTER — Ambulatory Visit (INDEPENDENT_AMBULATORY_CARE_PROVIDER_SITE_OTHER): Payer: BLUE CROSS/BLUE SHIELD | Admitting: Obstetrics and Gynecology

## 2018-06-09 VITALS — BP 100/78 | HR 113 | Ht 66.0 in | Wt 128.0 lb

## 2018-06-09 DIAGNOSIS — Z01419 Encounter for gynecological examination (general) (routine) without abnormal findings: Secondary | ICD-10-CM

## 2018-06-09 DIAGNOSIS — Z124 Encounter for screening for malignant neoplasm of cervix: Secondary | ICD-10-CM | POA: Diagnosis not present

## 2018-06-09 DIAGNOSIS — Z113 Encounter for screening for infections with a predominantly sexual mode of transmission: Secondary | ICD-10-CM | POA: Insufficient documentation

## 2018-06-09 DIAGNOSIS — Z3041 Encounter for surveillance of contraceptive pills: Secondary | ICD-10-CM

## 2018-06-09 DIAGNOSIS — Z1151 Encounter for screening for human papillomavirus (HPV): Secondary | ICD-10-CM | POA: Diagnosis present

## 2018-06-09 DIAGNOSIS — R87612 Low grade squamous intraepithelial lesion on cytologic smear of cervix (LGSIL): Secondary | ICD-10-CM | POA: Insufficient documentation

## 2018-06-09 DIAGNOSIS — R102 Pelvic and perineal pain: Secondary | ICD-10-CM

## 2018-06-09 MED ORDER — DROSPIRENONE-ETHINYL ESTRADIOL 3-0.02 MG PO TABS: 1.0000 | ORAL_TABLET | Freq: Every day | ORAL | 3 refills | Status: AC

## 2018-06-09 NOTE — Patient Instructions (Signed)
I value your feedback and entrusting us with your care. If you get a [facility] patient survey, I would appreciate you taking the time to let us know about your experience today. Thank you! 

## 2018-06-09 NOTE — Progress Notes (Signed)
Chief Complaint  Patient presents with  . Gynecologic Exam    has had sharp pain on entire pelvic area x 2 months     HPI:      [name] is a 23 y.o. G0P0000 who LMP was Patient's last menstrual period was 05/06/2018 (approximate)., presents today for her annual examination.  Her menses are regular every 28-30 days, lasting 4-5 days.  Dysmenorrhea mild, greatly improved with OCPs.  She does not have intermenstrual bleeding.  Sex activity: not currently but has been since last annual. On OCPs for dysmen. Last Pap: 01/05/17  Results were: LGSIL. Repeat due today due to age.  Hx of STDs: HPV on pap  She complains of BLQ pelvic pains over the past 2 months. Sx are random, sharp, and intermittent, lasting sev minutes. No aggrav/allev factors. Pt with regular GI issues of nausea/GI upset. No urin, vag sx. Not currently sex active.   There is no FH of breast cancer. There is a FH of ovarian cancer in her MGGM, genetic testing not indcated. The patient does not do self-breast exams.  Tobacco use: vaping to quit smoking Alcohol use: few drinks weekly Drug use: not currently Exercise: not active  She does not get adequate calcium and Vitamin D in her diet.   Past Medical History:  Diagnosis Date  . Anxiety   . Bipolar disorder (HCC)   . Headache   . 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 Mild depression (HCC)   . SVT (supraventricular tachycardia) (HCC)   . Vaccine for human papilloma virus (HPV) types 6, 11, 16, and 18 administered    complete    History reviewed. No pertinent surgical history.  Family History  Problem Relation Age of Onset  . Bipolar disorder Father   . Ovarian cancer Other     Social History   Socioeconomic History  . Marital status: Single    Spouse name: Not on file  . Number of children: Not on file  . Years of education: Not on file  . Highest education level: Not on file  Occupational History  . Not on file  Social Needs  .  Financial resource strain: Not on file  . Food insecurity:    Worry: Not on file    Inability: Not on file  . Transportation needs:    Medical: Not on file    Non-medical: Not on file  Tobacco Use  . Smoking status: Former Smoker    Packs/day: 0.10    Types: Cigarettes  . Smokeless tobacco: Never Used  . Tobacco comment: 2 packs per week  Substance and Sexual Activity  . Alcohol use: Not Currently    Alcohol/week: 0.0 - 1.0 standard drinks    Comment: has not drank in 2 weeks, social drinker  . Drug use: Not Currently    Types: Marijuana    Comment: daily use  . Sexual activity: Not Currently    Birth control/protection: Pill  Lifestyle  . Physical activity:    Days per week: Not on file    Minutes per session: Not on file  . Stress: Not on file  Relationships  . Social connections:    Talks on phone: Not on file    Gets together: Not on file    Attends religious service: Not on file    Active member of club or organization: Not on file    Attends meetings of clubs or organizations: Not on file  Relationship status: Not on file  . Intimate partner violence:    Fear of current or ex partner: Not on file    Emotionally abused: Not on file    Physically abused: Not on file    Forced sexual activity: Not on file  Other Topics Concern  . Not on file  Social History Narrative  . Not on file     Current Outpatient Medications:  .  albuterol (PROVENTIL HFA;VENTOLIN HFA) 108 (90 Base) MCG/ACT inhaler, Inhale 2 puffs into the lungs every 6 (six) hours as needed for wheezing or shortness of breath., Disp: 1 Inhaler, Rfl: 0 .  budesonide-formoterol (SYMBICORT) 160-4.5 MCG/ACT inhaler, Inhale into the lungs., Disp: , Rfl:  .  drospirenone-ethinyl estradiol (YAZ,GIANVI,LORYNA) 3-0.02 MG tablet, Take 1 tablet by mouth daily., Disp: 3 Package, Rfl: 3 .  glycopyrrolate (ROBINUL) 1 MG tablet, , Disp: , Rfl:  .  metoprolol succinate (TOPROL-XL) 25 MG 24 hr tablet, , Disp: , Rfl:    ROS:  Review of Systems  Constitutional: Positive for fatigue. Negative for fever and unexpected weight change.  Respiratory: Negative for cough, shortness of breath and wheezing.   Cardiovascular: Negative for chest pain, palpitations and leg swelling.  Gastrointestinal: Positive for nausea. Negative for blood in stool, constipation, diarrhea and vomiting.  Endocrine: Negative for cold intolerance, heat intolerance and polyuria.  Genitourinary: Positive for vaginal discharge. Negative for dyspareunia, dysuria, flank pain, frequency, genital sores, hematuria, menstrual problem, pelvic pain, urgency, vaginal bleeding and vaginal pain.  Musculoskeletal: Negative for back pain, joint swelling and myalgias.  Skin: Negative for rash.  Neurological: Negative for dizziness, syncope, light-headedness, numbness and headaches.  Hematological: Negative for adenopathy.  Psychiatric/Behavioral: Negative for agitation, confusion, sleep disturbance and suicidal ideas. The patient is not nervous/anxious.      Objective: BP 100/78   Pulse (!) 113   Ht 5\' 6"  (1.676 m)   Wt 128 lb (58.1 kg)   LMP 05/06/2018 (Approximate)   BMI 20.66 kg/m    Physical Exam Constitutional:      Appearance: She is well-developed.  Genitourinary:     Vulva and vagina normal.     No vulval lesion or tenderness noted.     No vaginal discharge, erythema or tenderness.     No cervical motion tenderness or polyp.     Uterus is tender.     Uterus is not enlarged.     No right or left adnexal mass present.     Right adnexa tender.     Left adnexa tender.  Neck:     Musculoskeletal: Normal range of motion.     Thyroid: No thyromegaly.  Cardiovascular:     Rate and Rhythm: Normal rate and regular rhythm.     Heart sounds: Normal heart sounds. No murmur.  Pulmonary:     Effort: Pulmonary effort is normal.     Breath sounds: Normal breath sounds.  Chest:     Breasts:        Right: No mass, nipple discharge, skin  change or tenderness.        Left: No mass, nipple discharge, skin change or tenderness.  Abdominal:     Palpations: Abdomen is soft.     Tenderness: There is abdominal tenderness in the suprapubic area. There is no guarding.  Musculoskeletal: Normal range of motion.  Neurological:     Mental Status: She is alert and oriented to person, place, and time.     Cranial Nerves: No cranial nerve deficit.  Psychiatric:        Behavior: Behavior normal.  Vitals signs reviewed.     Assessment/Plan: Encounter for annual routine gynecological examination  Cervical cancer screening - Plan: Cytology - PAP  Screening for STD (sexually transmitted disease) - Plan: Cytology - PAP  Screening for HPV (human papillomavirus) - Plan: Cytology - PAP  LGSIL on Pap smear of cervix - Repeat pap. Will call with results. - Plan: Cytology - PAP  Encounter for surveillance of contraceptive pills - OCP RF - Plan: drospirenone-ethinyl estradiol (YAZ,GIANVI,LORYNA) 3-0.02 MG tablet  Pelvic pain - Tender on exam. Check STDs/gyn u/s. Will call with results. If neg, question GI.  - Plan: US PELVIS TRANSVANGINAL NON-OB (TV ONLY)  Meds ordered this encounter  Medications  . drospirenone-ethinyl estradiol (YAZ,GIANVI,LORYNA) 3-0.02 MG tablet    Sig: Take 1 tablet by mouth daily.    Dispense:  3 Package    Refill:  3    Order Specific Question:   Supervising Provider    Answer:   Nadara Mustard [id number]              GYN counsel  adequate intake of calcium and vitamin D, diet and exercise, tobacco cessation.     F/U  Return in about 1 day (around 06/10/2018) for GYn u/s for pelvic pain--[facility] to call pt.  Ermal Haberer B. Twanisha Foulk, PA-C 06/09/2018 10:46 AM

## 2018-06-10 ENCOUNTER — Ambulatory Visit (INDEPENDENT_AMBULATORY_CARE_PROVIDER_SITE_OTHER): Payer: BLUE CROSS/BLUE SHIELD

## 2018-06-10 DIAGNOSIS — R102 Pelvic and perineal pain: Secondary | ICD-10-CM | POA: Diagnosis not present

## 2018-06-14 LAB — CYTOLOGY - PAP
Chlamydia: NEGATIVE
DIAGNOSIS: UNDETERMINED — AB
HPV: DETECTED — AB
Neisseria Gonorrhea: NEGATIVE

## 2018-11-27 IMAGING — CR DG CHEST 2V
1 series · 2 of 2 positions shown · non-contrast
Comparison: March 20, 2017

CLINICAL DATA: Central chest pain.

EXAM:
CHEST - 2 VIEW

[Series 1: dg chest 2 view · 0.14mm/px · 2 of 2 slices shown]
[im 1/2]
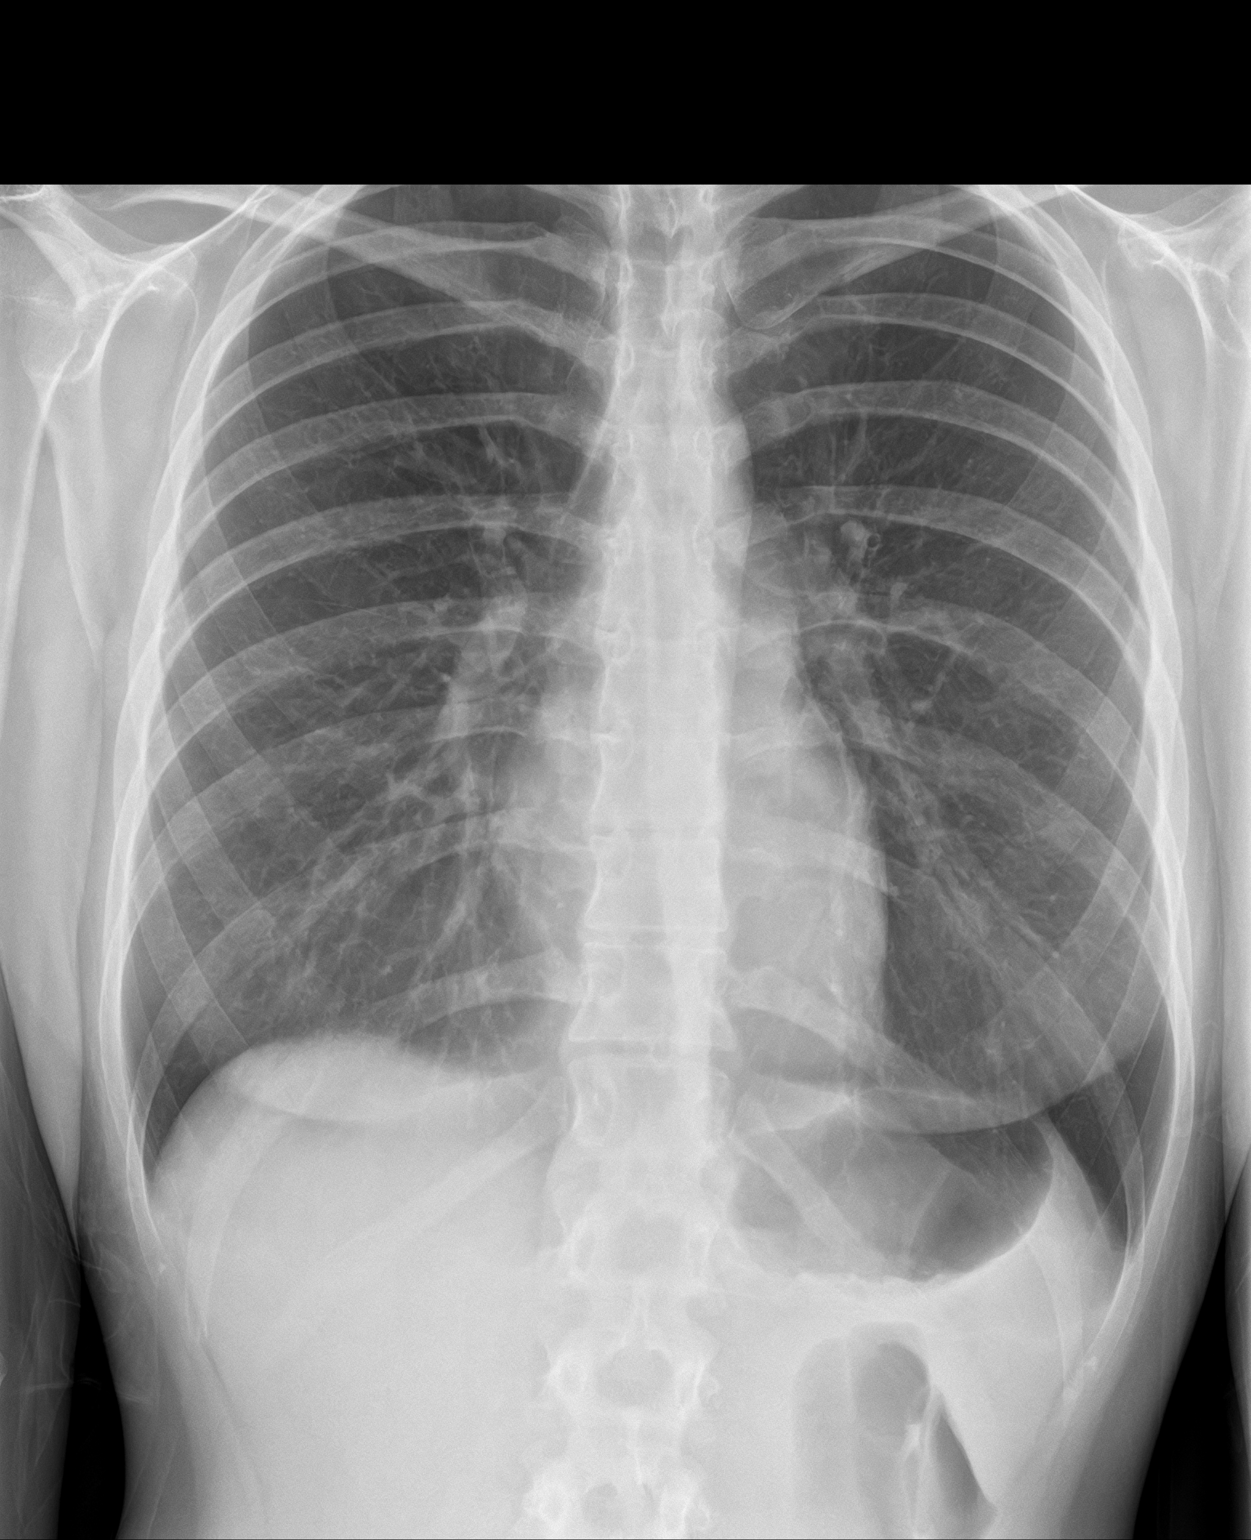
[im 2/2]
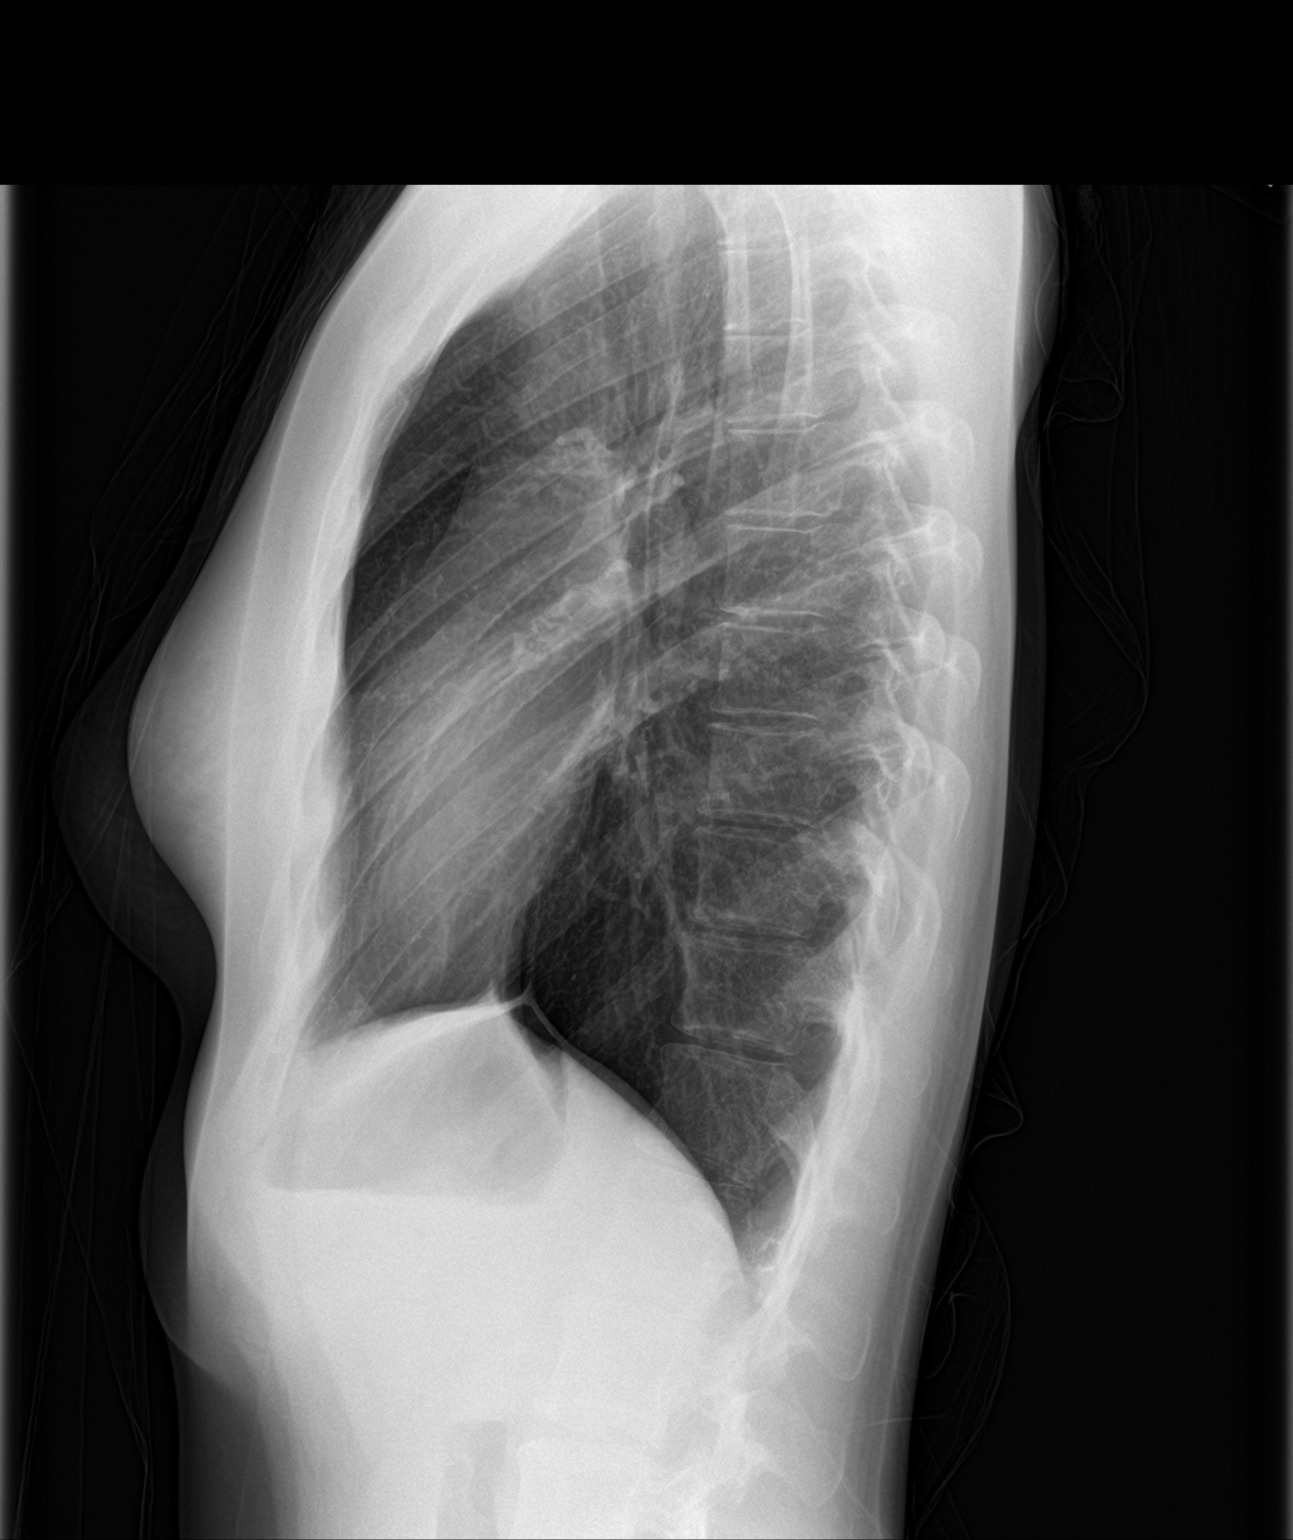

[2 of 2 positions shown; findings below may reference images not displayed]

FINDINGS: The heart size and mediastinal contours are within normal limits.
Both lungs are clear. The visualized skeletal structures are
unremarkable.
IMPRESSION: No active cardiopulmonary disease.

## 2019-05-03 ENCOUNTER — Other Ambulatory Visit: Payer: Self-pay | Admitting: Obstetrics and Gynecology

## 2019-05-03 DIAGNOSIS — Z3041 Encounter for surveillance of contraceptive pills: Secondary | ICD-10-CM

## 2019-08-07 ENCOUNTER — Ambulatory Visit: Payer: BLUE CROSS/BLUE SHIELD | Attending: Internal Medicine

## 2019-08-07 DIAGNOSIS — Z23 Encounter for immunization: Secondary | ICD-10-CM

## 2019-08-07 NOTE — Progress Notes (Signed)
   Covid-19 Vaccination Clinic  Name:  Emily Hansen    MRN: [id number] DOB: 09/18/1995  08/07/2019  [name] was observed post Covid-19 immunization for 15 minutes without incident. She was provided with Vaccine Information Sheet and instruction to access the V-Safe system.   [name] was instructed to call 911 with any severe reactions post vaccine: Marland Kitchen Difficulty breathing  . Swelling of face and throat  . A fast heartbeat  . A bad rash all over body  . Dizziness and weakness   Immunizations Administered    Name Date Dose VIS Date Route   Pfizer COVID-19 Vaccine 08/07/2019  9:21 AM 0.3 mL 04/28/2019 Intramuscular   Manufacturer: [employer], [employer]   Lot: [id number]   NDC: [address]-7

## 2019-08-28 ENCOUNTER — Ambulatory Visit: Payer: BLUE CROSS/BLUE SHIELD | Attending: Internal Medicine

## 2019-08-28 DIAGNOSIS — Z23 Encounter for immunization: Secondary | ICD-10-CM

## 2019-08-28 NOTE — Progress Notes (Signed)
   Covid-19 Vaccination Clinic  Name:  Emily Hansen    MRN: [id number] DOB: 1995-06-06  08/28/2019  [name] was observed post Covid-19 immunization for 15 minutes without incident. She was provided with Vaccine Information Sheet and instruction to access the V-Safe system.   [name] was instructed to call 911 with any severe reactions post vaccine: Marland Kitchen Difficulty breathing  . Swelling of face and throat  . A fast heartbeat  . A bad rash all over body  . Dizziness and weakness   Immunizations Administered    Name Date Dose VIS Date Route   Pfizer COVID-19 Vaccine 08/28/2019  9:03 AM 0.3 mL 04/28/2019 Intramuscular   Manufacturer: [employer], [employer]   Lot: [id number]   NDC: [address]-0
